# Patient Record
Sex: Female | Born: 1950
Health system: Southern US, Community
[De-identification: ages and names within clinical notes are randomized; demographics above are authoritative.]

---

## 1999-07-30 ENCOUNTER — Encounter: Payer: Self-pay | Admitting: *Deleted

## 1999-07-30 ENCOUNTER — Ambulatory Visit (HOSPITAL_COMMUNITY): Admission: RE | Admit: 1999-07-30 | Discharge: 1999-07-30 | Payer: Self-pay | Admitting: *Deleted

## 2003-01-05 HISTORY — PX: BREAST CYST ASPIRATION: SHX578

## 2003-05-23 ENCOUNTER — Other Ambulatory Visit: Admission: RE | Admit: 2003-05-23 | Discharge: 2003-05-23 | Payer: Self-pay | Admitting: Family Medicine

## 2003-07-01 ENCOUNTER — Encounter: Admission: RE | Admit: 2003-07-01 | Discharge: 2003-07-01 | Payer: Self-pay | Admitting: Surgery

## 2004-11-25 ENCOUNTER — Other Ambulatory Visit: Admission: RE | Admit: 2004-11-25 | Discharge: 2004-11-25 | Payer: Self-pay | Admitting: Obstetrics and Gynecology

## 2006-03-18 ENCOUNTER — Encounter: Admission: RE | Admit: 2006-03-18 | Discharge: 2006-03-18 | Payer: Self-pay | Admitting: Obstetrics and Gynecology

## 2006-03-24 ENCOUNTER — Encounter: Admission: RE | Admit: 2006-03-24 | Discharge: 2006-03-24 | Payer: Self-pay | Admitting: Obstetrics and Gynecology

## 2007-11-29 ENCOUNTER — Ambulatory Visit (HOSPITAL_COMMUNITY): Admission: RE | Admit: 2007-11-29 | Discharge: 2007-11-29 | Payer: Self-pay | Admitting: Obstetrics and Gynecology

## 2009-06-18 ENCOUNTER — Ambulatory Visit (HOSPITAL_COMMUNITY): Admission: RE | Admit: 2009-06-18 | Discharge: 2009-06-18 | Payer: Self-pay | Admitting: Obstetrics and Gynecology

## 2009-08-20 ENCOUNTER — Encounter: Admission: RE | Admit: 2009-08-20 | Discharge: 2009-10-02 | Payer: Self-pay | Admitting: Endocrinology

## 2010-08-20 ENCOUNTER — Ambulatory Visit (HOSPITAL_COMMUNITY)
Admission: RE | Admit: 2010-08-20 | Discharge: 2010-08-20 | Disposition: A | Discharge status: Home / Self Care | Payer: 59 | Source: Ambulatory Visit | Attending: Obstetrics and Gynecology | Admitting: Obstetrics and Gynecology

## 2010-08-20 ENCOUNTER — Other Ambulatory Visit (HOSPITAL_COMMUNITY): Payer: Self-pay | Admitting: Obstetrics and Gynecology

## 2010-08-20 DIAGNOSIS — Z1231 Encounter for screening mammogram for malignant neoplasm of breast: Secondary | ICD-10-CM | POA: Insufficient documentation

## 2012-08-23 ENCOUNTER — Other Ambulatory Visit (HOSPITAL_COMMUNITY): Payer: Self-pay | Admitting: Obstetrics and Gynecology

## 2012-08-23 DIAGNOSIS — Z1231 Encounter for screening mammogram for malignant neoplasm of breast: Secondary | ICD-10-CM

## 2012-08-25 ENCOUNTER — Ambulatory Visit (HOSPITAL_COMMUNITY)
Admission: RE | Admit: 2012-08-25 | Discharge: 2012-08-25 | Disposition: A | Discharge status: Home / Self Care | Payer: 59 | Source: Ambulatory Visit | Attending: Obstetrics and Gynecology | Admitting: Obstetrics and Gynecology

## 2012-08-25 DIAGNOSIS — Z1231 Encounter for screening mammogram for malignant neoplasm of breast: Secondary | ICD-10-CM | POA: Insufficient documentation

## 2012-08-28 ENCOUNTER — Ambulatory Visit (HOSPITAL_COMMUNITY)
Admission: RE | Admit: 2012-08-28 | Discharge: 2012-08-28 | Disposition: A | Discharge status: Home / Self Care | Payer: 59 | Source: Ambulatory Visit | Attending: Obstetrics and Gynecology | Admitting: Obstetrics and Gynecology

## 2013-06-20 ENCOUNTER — Ambulatory Visit (INDEPENDENT_AMBULATORY_CARE_PROVIDER_SITE_OTHER): Payer: 59

## 2013-06-20 ENCOUNTER — Ambulatory Visit: Payer: 59

## 2013-06-20 VITALS — BP 147/87 | HR 84 | Resp 12

## 2013-06-20 DIAGNOSIS — M2010 Hallux valgus (acquired), unspecified foot: Secondary | ICD-10-CM

## 2013-06-20 DIAGNOSIS — R52 Pain, unspecified: Secondary | ICD-10-CM

## 2013-06-20 DIAGNOSIS — M1990 Unspecified osteoarthritis, unspecified site: Secondary | ICD-10-CM

## 2013-06-20 DIAGNOSIS — M2020 Hallux rigidus, unspecified foot: Secondary | ICD-10-CM

## 2013-06-20 DIAGNOSIS — M2040 Other hammer toe(s) (acquired), unspecified foot: Secondary | ICD-10-CM

## 2013-06-20 NOTE — Patient Instructions (Signed)
Hallux Rigidus Hallux rigidus is a condition involving pain and a loss of motion of the first (big) toe. The pain gets worse with lifting up (extension) of the toe. This is usually due to arthritic bony bumps (spurring) of the joint at the base of the big toe.  SYMPTOMS   Pain, with lifting up of the toe.  Tenderness over the joint where the big toe meets the foot.  Redness, swelling, and warmth over the top of the base of the big toe (sometimes).  Foot pain, stiffness, and limping. CAUSES  Halllux rigidus is caused by arthritis of the joint where the big toe meets the foot. The arthritis creates a bone spur that pinches the soft tissues, when the toe is extended. RISK INCREASES WITH:  Tight shoes, with a narrow toe box.  Family history of foot problems.  Gout and rheumatoid and psoriatic arthritis.  History of previous toe injury, including "turf toe."  Long first toe, flat feet, and other big toe bony bumps.  Arthritis of the big toe. PREVENTION   Wear wide toed shoes that fit well.  Tape the big toe, to reduce motion and to prevent pinching of the tissues between the bone.  Maintain physical fitness:  Foot and ankle flexibility.  Muscle strength and endurance. PROGNOSIS  This condition can usually be managed with proper treatment. However, surgery is typically required to prevent the problem from recurring.  RELATED COMPLICATIONS  Injury to other areas of the foot or ankle, caused by abnormal walking in an attempt to avoid the pain felt when walking normally. TREATMENT Treatment first involves stopping the activities that aggravate your symptoms. Ice and medicine can be used to reduce the pain and inflammation. Modifications to shoes may help reduce pain, including wearing stiff-soled shoes, shoes with a wide toe box, inserting a padded donut to relieve pressure on top of the joint, or wearing an arch support. Corticosteroid injections may be given to reduce inflammation.  If non-surgical treatment is unsuccessful, surgery may be needed. Surgical options include removing the arthritic bony spur, cutting a bone in the foot to change the arc of motion (allowing the toe to extend more), or fusion of the joint (eliminating all motion in the joint at the base of the big toe).  MEDICATION   If pain medicine is needed, nonsteroidal anti-inflammatory medicines (aspirin and ibuprofen), or other minor pain relievers (acetaminophen), are often advised.  Do not take pain medicine for 7 days before surgery.  Prescription pain relievers are usually prescribed only after surgery. Use only as directed and only as much as you need.  Ointments for arthritis, applied to the skin, may give some relief.  Injections of corticosteroids may be given to reduce inflammation. HEAT AND COLD  Cold treatment (icing) relieves pain and reduces inflammation. Cold treatment should be applied for 10 to 15 minutes every 2 to 3 hours, and immediately after activity that aggravates your symptoms. Use ice packs or an ice massage.  Heat treatment may be used before performing the stretching and strengthening activities prescribed by your caregiver, physical therapist, or athletic trainer. Use a heat pack or a warm water soak. SEEK MEDICAL CARE IF:   Symptoms get worse or do not improve in 2 weeks, despite treatment.  After surgery you develop fever, increasing pain, redness, swelling, drainage of fluids, bleeding, or increasing warmth.  New, unexplained symptoms develop. (Drugs used in treatment may produce side effects.) Document Released: 12/21/2004 Document Revised: 03/15/2011 Document Reviewed: 04/04/2008 Exit[employer] Patient   Information 2015 [employer]. This information is not intended to replace advice given to you by your health care Yeray Tomas. Make sure you discuss any questions you have with your health care Acxel Dingee.  

## 2013-06-20 NOTE — Progress Notes (Signed)
   Subjective:    Patient ID: Jasmine Chambers, female    DOB: 01/19/1950, 63 y.o.   MRN: [id number]  HPI PT STATED RT FOOT 1ST AND 3RD TOE IS BEEN HURTING FOR 1-2 YEARS. THE TOES IS GETTING WORSE. THE TOES GET AGGRAVATED BY PRESSURE AND TRIED TO WEAR TOE SEPARATOR BUT IT HELPS SOME.    Review of Systems  All other systems reviewed and are negative.      Objective:   Physical Exam Neurovascular status is intact pedal pulses palpable DP and PT +2/4 capillary refill time 3 seconds all digits neurologically skin color pigment normal hair growth absent nails, criptotic orthopedic biomechanical exam feels rectus foot type with slight HAV deformity bilateral there is adductovarus rotation and rigid contracture lesser digits 2345 right more so than left fourth right actually she called the third is at the fourth toe right is the most symptomatic she is walking on the tip of the toe causing a slight distal clavus. Patient has limited range of motion of the first MTP area bilateral it with 45 on the left only about 5-10 on the right x-rays demonstrate asymmetric joint space during the right first MP. It were spurring and dorsal spurring and osteophyte of dorsal first metatarsal phalangeal joint mild last opinion is also identified. Clinically patient has limited range of motion right hallux more so than left has and extensors deformity of the IP joints of both hallux right more so than left now asymptomatic however with certain shoes activities becomes aggravated also has difficulty with certain shoes with the third or correction fourth toe right foot due to semirigid digital contractures.     Assessment & Plan:  Assessment this time is capsulitis and hallux rigidus with posture arthropathy first MTP joint right mild HAV deformity and hammertoe deformities with rigid digital contractures of the fourth right being noted left foot is not as severe both from hallux rigidus and hammertoe deformities. Patient  given literature about osteoarthritis and hallux rigidus for her review of is a strong candidate some point in the future for possible arthrotomy with possible implant arthroplasty consider a year silastic or a two-piece or one-piece hemi-implant to will discuss more details when she is ready to consider surgery currently not having pain at this time elects to follow a conservative care currently wearing a [employer] thick soled shoes advised to maintain thick soled shoes no barefoot no flimsy shoes or flip-flops also recommended Advil or ibuprofen or Tylenol as needed for pain in the interim which are dispensed for her review and will followup when she is ready to consider surgical intervention. Offer the possibly of a steroid injection for temporary relief if needed  Alvan Dameichard Sikora DPM

## 2014-06-18 ENCOUNTER — Other Ambulatory Visit (HOSPITAL_COMMUNITY): Payer: Self-pay | Admitting: Obstetrics and Gynecology

## 2014-06-18 DIAGNOSIS — Z1231 Encounter for screening mammogram for malignant neoplasm of breast: Secondary | ICD-10-CM

## 2014-06-21 ENCOUNTER — Ambulatory Visit (HOSPITAL_COMMUNITY)
Admission: RE | Admit: 2014-06-21 | Discharge: 2014-06-21 | Disposition: A | Discharge status: Home / Self Care | Payer: 59 | Source: Ambulatory Visit | Attending: Obstetrics and Gynecology | Admitting: Obstetrics and Gynecology

## 2014-06-21 DIAGNOSIS — Z1231 Encounter for screening mammogram for malignant neoplasm of breast: Secondary | ICD-10-CM | POA: Diagnosis present

## 2015-07-21 DIAGNOSIS — E785 Hyperlipidemia, unspecified: Secondary | ICD-10-CM | POA: Diagnosis not present

## 2015-07-21 DIAGNOSIS — Z23 Encounter for immunization: Secondary | ICD-10-CM | POA: Diagnosis not present

## 2015-07-21 DIAGNOSIS — Z0000 Encounter for general adult medical examination without abnormal findings: Secondary | ICD-10-CM | POA: Diagnosis not present

## 2015-07-21 DIAGNOSIS — Z131 Encounter for screening for diabetes mellitus: Secondary | ICD-10-CM | POA: Diagnosis not present

## 2015-07-21 DIAGNOSIS — I10 Essential (primary) hypertension: Secondary | ICD-10-CM | POA: Diagnosis not present

## 2015-07-21 DIAGNOSIS — R7309 Other abnormal glucose: Secondary | ICD-10-CM | POA: Diagnosis not present

## 2015-10-21 ENCOUNTER — Encounter: Payer: Self-pay | Admitting: Sports Medicine

## 2015-10-21 ENCOUNTER — Ambulatory Visit (INDEPENDENT_AMBULATORY_CARE_PROVIDER_SITE_OTHER): Payer: Medicare Other | Admitting: Sports Medicine

## 2015-10-21 ENCOUNTER — Ambulatory Visit (INDEPENDENT_AMBULATORY_CARE_PROVIDER_SITE_OTHER): Payer: Medicare Other

## 2015-10-21 DIAGNOSIS — M79673 Pain in unspecified foot: Secondary | ICD-10-CM | POA: Diagnosis not present

## 2015-10-21 DIAGNOSIS — M2021 Hallux rigidus, right foot: Secondary | ICD-10-CM

## 2015-10-21 DIAGNOSIS — M8580 Other specified disorders of bone density and structure, unspecified site: Secondary | ICD-10-CM | POA: Diagnosis not present

## 2015-10-21 DIAGNOSIS — M2022 Hallux rigidus, left foot: Secondary | ICD-10-CM

## 2015-10-21 LAB — CALCIUM: CALCIUM: 9.4 mg/dL (ref 8.6–10.4)

## 2015-10-21 NOTE — Progress Notes (Signed)
Subjective: Jasmine Chambers is a 65 y.o. female patient who presents to office for evaluation of Right> Left bunion pain. Patient complains of progressive pain especially over the last year in the Right>Left foot that starts as pain over the bump with direct pressure and range of motion and is now interferring with daily activities.  Patient has also tried change in shoes. Worse with high heels. Patient denies any other pedal complaints.   There are no active problems to display for this patient.   Current Outpatient Prescriptions on File Prior to Visit  Medication Sig Dispense Refill  . ACCU-CHEK AVIVA PLUS test strip     . ACCU-CHEK SOFTCLIX LANCETS lancets     . ARMOUR THYROID 60 MG tablet     . liothyronine (CYTOMEL) 25 MCG tablet     . Progesterone Micronized (PROGESTERONE, BULK,) POWD     . Testosterone Propionate POWD      No current facility-administered medications on file prior to visit.     No Known Allergies  Objective:  General: Alert and oriented x3 in no acute distress  Dermatology: No open lesions bilateral lower extremities, no webspace macerations, no ecchymosis bilateral, all nails x 10 are well manicured.  Vascular: Dorsalis Pedis and Posterior Tibial pedal pulses 2/4, Capillary Fill Time 3 seconds, (+) pedal hair growth bilateral, no edema bilateral lower extremities, Temperature gradient within normal limits.  Neurology: Michaell CowingGross sensation intact via light touch bilateral, Protective sensation intact with Phoebe PerchSemmes Weinstein Monofilament to all pedal sites, Position sense intact, vibratory intact bilateral, Deep tendon reflexes within normal limits bilateral, No babinski sign present bilateral.   Musculoskeletal: Mild tenderness with palpation right>left dorsal bunion deformity with limitation or no crepitus with range of motion. Midtarsal, Subtalar joint, and ankle joint range of motion is within normal limits. On weightbearing exam, there is decreased 1st MTPJ rom  Right>Left with functional limitus noted, there is medial arch collapse Right> Left on weightbearing, rearfoot slight valgus, forefoot slight abduction with rigidus deformity supported on ground with no second toe crossover deformity noted.   Xrays  Right and Left Foot    Impression: Mild decreased mineralization. 1st MTPJ joint narrowing with dorsal spur, mild elevatus, calcaneal spur and midtarsal breech. No other acute findings.       Assessment and Plan: Problem List Items Addressed This Visit    None    Visit Diagnoses    Pain of foot, unspecified laterality    -  Primary   Relevant Orders   DG Foot 2 Views Left   DG Foot 2 Views Right   Calcium   Hallux rigidus of both feet       Relevant Orders   Calcium   Osteopenia, unspecified location       Relevant Orders   Calcium       -Complete examination performed -Xrays reviewed -Discussed treatement options; discussed Rigidus deformity;conservative and  Surgical management; risks, benefits, alternatives discussed. All patient's questions answered. -Rx bloodwork to check calcium to determine if supplication is needed in prep for surgery; Youngswick in right  -Recommend continue with good supportive shoes and inserts.  -Patient to return to office as needed or sooner if condition worsens. Will call patient with lab results to determine surgery consult timeframe based on results  Asencion Islamitorya Gorge Almanza, DPM

## 2015-10-21 NOTE — Patient Instructions (Signed)
Hallux Rigidus Hallux rigidus is a condition involving pain and a loss of motion of the first (big) toe. The pain gets worse with lifting up (extension) of the toe. This is usually due to arthritic bony bumps (spurring) of the joint at the base of the big toe.  SYMPTOMS   Pain, with lifting up of the toe.  Tenderness over the joint where the big toe meets the foot.  Redness, swelling, and warmth over the top of the base of the big toe (sometimes).  Foot pain, stiffness, and limping. CAUSES  Hallux rigidus is caused by arthritis of the joint where the big toe meets the foot. The arthritis creates a bone spur that pinches the soft tissues when the toe is extended. RISK INCREASES WITH:  Tight shoes with a narrow toe box.  Family history of foot problems.  Gout and rheumatoid and psoriatic arthritis.  History of previous toe injury, including "turf toe."  Long first toe, flat feet, and other big toe bony bumps.  Arthritis of the big toe. PREVENTION   Wear wide-toed shoes that fit well.  Tape the big toe to reduce motion and to prevent pinching of the tissues between the bone.  Maintain physical fitness:  Foot and ankle flexibility.  Muscle strength and endurance. PROGNOSIS  This condition can usually be managed with proper treatment. However, surgery is typically required to prevent the problem from recurring.  RELATED COMPLICATIONS  Injury to other areas of the foot or ankle, caused by abnormal walking in an attempt to avoid the pain felt when walking normally. TREATMENT Treatment first involves stopping the activities that aggravate your symptoms. Ice and medicine can be used to reduce the pain and inflammation. Modifications to shoes may help reduce pain, including wearing stiff-soled shoes, shoes with a wide toe box, inserting a padded donut to relieve pressure on top of the joint, or wearing an arch support. Corticosteroid injections may be given to reduce inflammation. If  nonsurgical treatment is unsuccessful, surgery may be needed. Surgical options include removing the arthritic bony spur, cutting a bone in the foot to change the arc of motion (allowing the toe to extend more), or fusion of the joint (eliminating all motion in the joint at the base of the big toe).  MEDICATION   If pain medicine is needed, nonsteroidal anti-inflammatory medicines (aspirin and ibuprofen), or other minor pain relievers (acetaminophen), are often advised.  Do not take pain medicine for 7 days before surgery.  Prescription pain relievers are usually prescribed only after surgery. Use only as directed and only as much as you need.  Ointments for arthritis, applied to the skin, may give some relief.  Injections of corticosteroids may be given to reduce inflammation. HEAT AND COLD  Cold treatment (icing) relieves pain and reduces inflammation. Cold treatment should be applied for 10 to 15 minutes every 2 to 3 hours, and immediately after activity that aggravates your symptoms. Use ice packs or an ice massage.  Heat treatment may be used before performing the stretching and strengthening activities prescribed by your caregiver, physical therapist, or athletic trainer. Use a heat pack or a warm water soak. SEEK MEDICAL CARE IF:   Symptoms get worse or do not improve in 2 weeks, despite treatment.  After surgery you develop fever, increasing pain, redness, swelling, drainage of fluids, bleeding, or increasing warmth.  New, unexplained symptoms develop. (Drugs used in treatment may produce side effects.)   This information is not intended to replace advice given to   you by your health care provider. Make sure you discuss any questions you have with your health care provider.   Document Released: 12/21/2004 Document Revised: 01/11/2014 Document Reviewed: 04/04/2008 Elsevier Interactive Patient Education 2016 Else[employer].  

## 2015-10-24 ENCOUNTER — Telehealth: Payer: Self-pay | Admitting: *Deleted

## 2015-10-24 NOTE — Telephone Encounter (Addendum)
-----   Message from Asencion Islamitorya Stover, [place] sent at 10/22/2015  7:25 AM EDT ----- Naaman PlummerHi Valery, Can you let patient know that her Calcium levels are normal and that I can see her in 2 weeks (10/31) for surgery consult if she desires to proceed with a more in depth surgical discussion and to sign consent Thanks Dr. Marylene LandStover. 10/24/2015-left message with Dr. Wynema BirchStover's review of labs and recommendation.

## 2015-11-04 ENCOUNTER — Ambulatory Visit (INDEPENDENT_AMBULATORY_CARE_PROVIDER_SITE_OTHER): Payer: Medicare Other | Admitting: Sports Medicine

## 2015-11-04 ENCOUNTER — Encounter: Payer: Self-pay | Admitting: Sports Medicine

## 2015-11-04 DIAGNOSIS — M79673 Pain in unspecified foot: Secondary | ICD-10-CM

## 2015-11-04 DIAGNOSIS — M2022 Hallux rigidus, left foot: Secondary | ICD-10-CM

## 2015-11-04 DIAGNOSIS — M2021 Hallux rigidus, right foot: Secondary | ICD-10-CM

## 2015-11-04 NOTE — Progress Notes (Signed)
Subjective: Felecia ShellingMinnie P Hartis is a 65 y.o. female patient who returns to office for evaluation of Right> Left bunion pain and for surgery consultation after completing blood work. Patient denies any other pedal complaints.   There are no active problems to display for this patient.   Current Outpatient Prescriptions on File Prior to Visit  Medication Sig Dispense Refill  . ACCU-CHEK AVIVA PLUS test strip     . ACCU-CHEK SOFTCLIX LANCETS lancets     . ARMOUR THYROID 60 MG tablet     . hydrochlorothiazide (HYDRODIURIL) 25 MG tablet Take 25 mg by mouth daily.  1  . liothyronine (CYTOMEL) 25 MCG tablet     . Progesterone Micronized (PROGESTERONE, BULK,) POWD     . Testosterone Propionate POWD      No current facility-administered medications on file prior to visit.     No Known Allergies   No family history on file.   No past surgical history on file.   Social History   Social History  . Marital status: Married    Spouse name: N/A  . Number of children: N/A  . Years of education: N/A   Social History Main Topics  . Smoking status: Never Smoker  . Smokeless tobacco: None  . Alcohol use No  . Drug use: No  . Sexual activity: Not Asked   Other Topics Concern  . None   Social History Narrative  . None   Objective:  General: Alert and oriented x3 in no acute distress  Dermatology: No open lesions bilateral lower extremities, no webspace macerations, no ecchymosis bilateral, all nails x 10 are well manicured.  Vascular: Dorsalis Pedis and Posterior Tibial pedal pulses 2/4, Capillary Fill Time 3 seconds, (+) pedal hair growth bilateral, no edema bilateral lower extremities, Temperature gradient within normal limits.  Neurology: Michaell CowingGross sensation intact via light touch bilateral, Protective sensation intact with Phoebe PerchSemmes Weinstein Monofilament to all pedal sites, Position sense intact, vibratory intact bilateral, Deep tendon reflexes within normal limits bilateral, No babinski sign  present bilateral.   Musculoskeletal: Mild tenderness with palpation right>left dorsal bunion deformity with limitation or no crepitus with range of motion. Midtarsal, Subtalar joint, and ankle joint range of motion is within normal limits. On weightbearing exam, there is decreased 1st MTPJ rom Right>Left with functional limitus noted, there is medial arch collapse Right> Left on weightbearing, rearfoot slight valgus, forefoot slight abduction with rigidus deformity supported on ground with no second toe crossover deformity noted.       Assessment and Plan: Problem List Items Addressed This Visit    None    Visit Diagnoses    Hallux rigidus of both feet    -  Primary   R>L   Pain of foot, unspecified laterality          -Complete examination performed -Vit D/Calcuim labs normal -Previous Xrays reviewed  -Discussed treatement options; discussed Rigidus deformity;conservative and  Surgical management; risks, benefits, alternatives discussed. All patient's questions answered. -Patient opt for surgical management. Consent obtained for right youngswick with screw and stravix allograft. Pre and Post op course explained. Risks, benefits, alternatives explained. No guarantees given or implied. Surgical booking slip submitted and provided patient with Surgical packet and info for [facility]. -Dispensed CAM Walker to use post op. To dispense crutches at surgery center -Recommend continue with good supportive shoes and inserts meanwhile.  -Patient to return to office after surgery or sooner if condition worsens.   Asencion Islamitorya Peaches Vanoverbeke, DPM

## 2015-11-04 NOTE — Patient Instructions (Signed)
Pre-Operative Instructions  Congratulations, you have decided to take an important step to improving your quality of life.  You can be assured that the doctors of [facility] will be with you every step of the way.  1. Plan to be at the surgery center/hospital at least 1 (one) hour prior to your scheduled time unless otherwise directed by the surgical center/hospital staff.  You must have a responsible adult accompany you, remain during the surgery and drive you home.  Make sure you have directions to the surgical center/hospital and know how to get there on time. 2. For hospital based surgery you will need to obtain a history and physical form from your family physician within 1 month prior to the date of surgery- we will give you a form for you primary physician.  3. We make every effort to accommodate the date you request for surgery.  There are however, times where surgery dates or times have to be moved.  We will contact you as soon as possible if a change in schedule is required.   4. No Aspirin/Ibuprofen for one week before surgery.  If you are on aspirin, any non-steroidal anti-inflammatory medications (Mobic, Aleve, Ibuprofen) you should stop taking it 7 days prior to your surgery.  You make take Tylenol  For pain prior to surgery.  5. Medications- If you are taking daily heart and blood pressure medications, seizure, reflux, allergy, asthma, anxiety, pain or diabetes medications, make sure the surgery center/hospital is aware before the day of surgery so they may notify you which medications to take or avoid the day of surgery. 6. No food or drink after midnight the night before surgery unless directed otherwise by surgical center/hospital staff. 7. No alcoholic beverages 24 hours prior to surgery.  No smoking 24 hours prior to or 24 hours after surgery. 8. Wear loose pants or shorts- loose enough to fit over bandages, boots, and casts. 9. No slip on shoes, sneakers are best. 10. Bring  your boot with you to the surgery center/hospital.  Also bring crutches or a walker if your physician has prescribed it for you.  If you do not have this equipment, it will be provided for you after surgery. 11. If you have not been contracted by the surgery center/hospital by the day before your surgery, call to confirm the date and time of your surgery. 12. Leave-time from work may vary depending on the type of surgery you have.  Appropriate arrangements should be made prior to surgery with your employer. 13. Prescriptions will be provided immediately following surgery by your doctor.  Have these filled as soon as possible after surgery and take the medication as directed. 14. Remove nail polish on the operative foot. 15. Wash the night before surgery.  The night before surgery wash the foot and leg well with the antibacterial soap provided and water paying special attention to beneath the toenails and in between the toes.  Rinse thoroughly with water and dry well with a towel.  Perform this wash unless told not to do so by your physician.  Enclosed: 1 Ice pack (please put in freezer the night before surgery)   1 Hibiclens skin cleaner   Pre-op Instructions  If you have any questions regarding the instructions, do not hesitate to call our office.  [place]: [address] [place], [place] [address] [phone]  [place]: [address] , [place] [address] [phone]  [place]: [address]  [place], [place] [address] [phone]   Dr.   Norman Regal DPM, Dr. Matthew Wagoner DPM, Dr. M. Todd Hyatt DPM, Dr. Rodney Wigger DPM 

## 2015-11-06 ENCOUNTER — Telehealth: Payer: Self-pay | Admitting: *Deleted

## 2015-11-06 NOTE — Telephone Encounter (Signed)
[employer] has a graft I can use. Matt sides the rep is aware and said he will talk with Arlys JohnBrian. -Dr. Marylene LandStover

## 2015-11-06 NOTE — Telephone Encounter (Signed)
"  The Jasmine Chambers is too expensive.  Is there something else she can use for this patient?  Medicare is her primary insurance.  She is scheduled for November 13."  I will send Dr. Marylene LandStover a message and call you back.

## 2015-11-07 DIAGNOSIS — R05 Cough: Secondary | ICD-10-CM | POA: Diagnosis not present

## 2015-11-17 ENCOUNTER — Encounter: Payer: Self-pay | Admitting: Sports Medicine

## 2015-11-17 DIAGNOSIS — M2021 Hallux rigidus, right foot: Secondary | ICD-10-CM | POA: Diagnosis not present

## 2015-11-17 DIAGNOSIS — M2011 Hallux valgus (acquired), right foot: Secondary | ICD-10-CM | POA: Diagnosis not present

## 2015-11-17 DIAGNOSIS — E7800 Pure hypercholesterolemia, unspecified: Secondary | ICD-10-CM | POA: Diagnosis not present

## 2015-11-17 DIAGNOSIS — M25571 Pain in right ankle and joints of right foot: Secondary | ICD-10-CM | POA: Diagnosis not present

## 2015-11-18 ENCOUNTER — Telehealth: Payer: Self-pay | Admitting: Sports Medicine

## 2015-11-18 NOTE — Telephone Encounter (Signed)
Post op check phone call made to patient. Husband answered stating that wife is doing good. Sensation is starting to slowly come back in her surgical foot. I advised to continue with taking pain medication of Norco as Rx, elevating and icing. Husband states that they have not iced yet I encouraged icing 20 mins of each hour behind knee while in boot or with boot off directly to top of foot. Husband expressed understanding. Patient to follow up next week in office for continued post-op care or sooner if problems arise. -Dr. Marylene LandStover

## 2015-11-19 ENCOUNTER — Telehealth: Payer: Self-pay | Admitting: *Deleted

## 2015-11-19 NOTE — Telephone Encounter (Signed)
Pt states she is having pain and wanted to know what to do? I asked pt to describe the pain and she said it was kind of an aching.  I told pt if she was able to tolerate Motrin OTC she could take 2 tablets 3-4 times in between the dosing of the pain medication. I told pt I could instruct her in a comfort measure to help alleviate some of the throbbing. I instructed pt to remove the surgical boot, remove the open ended sock, remove the ace wrap and elevate the foot for 15 minutes, if pain worsens elevated then dangle for 15 minutes and this would be the only time dangling was recommended, after the 15 minutes place foot level with hip and rewrap the ace wrap looser beginning with the toes going up the leg, replace sock and surgical boot. Pt asked that I explain to her husband Calvin.pt states she is consitpated. I told pt to purchase OTC Colace and take as directed for constipation.  I called back on Calvin's phone number and explained the comfort measure technique I had explained to pt. Calvin states understanding.

## 2015-11-25 ENCOUNTER — Ambulatory Visit (INDEPENDENT_AMBULATORY_CARE_PROVIDER_SITE_OTHER): Payer: Medicare Other | Admitting: Sports Medicine

## 2015-11-25 ENCOUNTER — Ambulatory Visit (INDEPENDENT_AMBULATORY_CARE_PROVIDER_SITE_OTHER): Payer: Medicare Other

## 2015-11-25 ENCOUNTER — Encounter: Payer: Self-pay | Admitting: Sports Medicine

## 2015-11-25 DIAGNOSIS — M79673 Pain in unspecified foot: Secondary | ICD-10-CM

## 2015-11-25 DIAGNOSIS — M2021 Hallux rigidus, right foot: Secondary | ICD-10-CM | POA: Diagnosis not present

## 2015-11-25 DIAGNOSIS — Z9889 Other specified postprocedural states: Secondary | ICD-10-CM

## 2015-11-25 DIAGNOSIS — M2022 Hallux rigidus, left foot: Secondary | ICD-10-CM

## 2015-11-25 DIAGNOSIS — M8580 Other specified disorders of bone density and structure, unspecified site: Secondary | ICD-10-CM

## 2015-11-25 MED ORDER — HYDROCODONE-ACETAMINOPHEN 10-325 MG PO TABS: 1.0000 | ORAL_TABLET | Freq: Four times a day (QID) | ORAL | 0 refills | Status: DC | PRN

## 2015-11-25 NOTE — Progress Notes (Signed)
  Subjective: Jasmine Chambers is a 65 y.o. female patient seen today in office for POV #1 (DOS 11-17-15), S/P Right youngswick bunionectomy with screw fixation. Patient denies pain at surgical site, denies calf pain, denies headache, chest pain, shortness of breath, nausea, vomiting, fever, or chills. Patient states that she is doing well and is only taking Ibuprofen and Norco but less than before. No other issues noted.   There are no active problems to display for this patient.   Current Outpatient Prescriptions on File Prior to Visit  Medication Sig Dispense Refill  . ACCU-CHEK AVIVA PLUS test strip     . ACCU-CHEK SOFTCLIX LANCETS lancets     . ARMOUR THYROID 60 MG tablet     . hydrochlorothiazide (HYDRODIURIL) 25 MG tablet Take 25 mg by mouth daily.  1  . liothyronine (CYTOMEL) 25 MCG tablet     . Progesterone Micronized (PROGESTERONE, BULK,) POWD     . Testosterone Propionate POWD      No current facility-administered medications on file prior to visit.     No Known Allergies  Objective: There were no vitals filed for this visit.  General: No acute distress, AAOx3  Right foot: Sutures intact with no gapping or dehiscence at surgical site, mild swelling to right forefoot, no erythema, no warmth, no drainage, no signs of infection noted, Capillary fill time <3 seconds in all digits, gross sensation present via light touch to right foot. No pain or crepitation with range of motion right foot.  No pain with calf compression.   Post Op Xray, Right foot: 1st met in excellent alignment and position. Osteotomy site healing. Hardware intact. Soft tissue swelling within normal limits for post op status.   Assessment and Plan:  Problem List Items Addressed This Visit    None    Visit Diagnoses    S/P foot surgery, right    -  Primary   Relevant Medications   HYDROcodone-acetaminophen (NORCO) 10-325 MG tablet   Hallux rigidus of both feet       Relevant Orders   DG Foot Complete  Right   Pain of foot, unspecified laterality       Relevant Orders   DG Foot Complete Right   Osteopenia, unspecified location       Relevant Orders   DG Foot Complete Right       -Patient seen and evaluated -Xrays reviewed  -Applied dry sterile dressing to surgical site right foot secured with ACE wrap and stockinet  -Advised patient to make sure to keep dressings clean, dry, and intact to right surgical site, removing the ACE as needed  -Advised patient to continue with CAM boot to right foot   -Advised patient to limit activity to necessity  -Advised patient to ice and elevate as necessary -Refilled Norco   -Will plan for suture removal at next office visit and will encourage range of motion. In the meantime, patient to call office if any issues or problems arise.   Landis Martins, DPM

## 2015-12-02 ENCOUNTER — Ambulatory Visit (INDEPENDENT_AMBULATORY_CARE_PROVIDER_SITE_OTHER): Payer: Medicare Other | Admitting: Sports Medicine

## 2015-12-02 ENCOUNTER — Encounter: Payer: Self-pay | Admitting: Sports Medicine

## 2015-12-02 DIAGNOSIS — M79673 Pain in unspecified foot: Secondary | ICD-10-CM

## 2015-12-02 DIAGNOSIS — M2021 Hallux rigidus, right foot: Secondary | ICD-10-CM

## 2015-12-02 DIAGNOSIS — M2022 Hallux rigidus, left foot: Secondary | ICD-10-CM

## 2015-12-02 DIAGNOSIS — M8580 Other specified disorders of bone density and structure, unspecified site: Secondary | ICD-10-CM

## 2015-12-02 DIAGNOSIS — Z9889 Other specified postprocedural states: Secondary | ICD-10-CM

## 2015-12-02 NOTE — Progress Notes (Signed)
  Subjective: Jasmine Chambers is a 65 y.o. female patient seen today in office for POV #2 (DOS 11-17-15), S/P Right youngswick bunionectomy with screw fixation. Patient denies pain at surgical site, denies calf pain, denies headache, chest pain, shortness of breath, nausea, vomiting, fever, or chills. Patient states that she is doing well and is only taking Ibuprofen and Norco but less than before, slowly weaning. No other issues noted.   There are no active problems to display for this patient.   Current Outpatient Prescriptions on File Prior to Visit  Medication Sig Dispense Refill  . ACCU-CHEK AVIVA PLUS test strip     . ACCU-CHEK SOFTCLIX LANCETS lancets     . ARMOUR THYROID 60 MG tablet     . docusate sodium (COLACE) 100 MG capsule TAKE ONE CAPSULE EVERY 6 HOURS AS NEEDED FOR CONSTIPATION  0  . hydrochlorothiazide (HYDRODIURIL) 25 MG tablet Take 25 mg by mouth daily.  1  . HYDROcodone-acetaminophen (NORCO) 10-325 MG tablet Take 1 tablet by mouth every 6 (six) hours as needed. for pain 30 tablet 0  . HYDROcodone-homatropine (HYCODAN) 5-1.5 MG/5ML syrup TAKE 5 ML AT BEDTIME AS NEEDED FOR COUGH  0  . liothyronine (CYTOMEL) 25 MCG tablet     . Progesterone Micronized (PROGESTERONE, BULK,) POWD     . promethazine (PHENERGAN) 25 MG tablet TAKE 1 TABLET EVERY 8 HOURS AS NEEDED FOR NAUSEA  0  . Testosterone Propionate POWD      No current facility-administered medications on file prior to visit.     No Known Allergies  Objective: There were no vitals filed for this visit.  General: No acute distress, AAOx3  Right foot: Sutures intact with no gapping or dehiscence at surgical site, mild swelling to right forefoot, no erythema, no warmth, no drainage, no signs of infection noted, Capillary fill time <3 seconds in all digits, gross sensation present via light touch to right foot. Mild pain without crepitation with range of motion right foot.  No pain with calf compression.    Assessment and  Plan:  Problem List Items Addressed This Visit    None    Visit Diagnoses    S/P foot surgery, right    -  Primary   Hallux rigidus of both feet       Pain of foot, unspecified laterality       Osteopenia, unspecified location           -Patient seen and evaluated -Sutures removed and steristips applied -Patient may shower and replace ACE wrap daily  -Advised patient to continue with CAM boot to right foot   -Advised patient to limit activity to necessity  -Encouraged range of motion exercises of 1st MTPJ as instructed  -Advised patient to ice and elevate as necessary -Continue with PRN pain medications  -Will plan for xrays, transition out of boot to post op shoe, and compression anklet at next office visit and will continue to encourage range of motion exercises. In the meantime, patient to call office if any issues or problems arise.   Jasmine Chambers, DPM

## 2015-12-12 ENCOUNTER — Encounter: Payer: Self-pay | Admitting: Sports Medicine

## 2015-12-16 ENCOUNTER — Ambulatory Visit (INDEPENDENT_AMBULATORY_CARE_PROVIDER_SITE_OTHER): Payer: Medicare Other

## 2015-12-16 ENCOUNTER — Ambulatory Visit (INDEPENDENT_AMBULATORY_CARE_PROVIDER_SITE_OTHER): Payer: Medicare Other | Admitting: Sports Medicine

## 2015-12-16 DIAGNOSIS — M2022 Hallux rigidus, left foot: Principal | ICD-10-CM

## 2015-12-16 DIAGNOSIS — M2021 Hallux rigidus, right foot: Secondary | ICD-10-CM | POA: Diagnosis not present

## 2015-12-16 DIAGNOSIS — M79673 Pain in unspecified foot: Secondary | ICD-10-CM

## 2015-12-16 DIAGNOSIS — Z9889 Other specified postprocedural states: Secondary | ICD-10-CM

## 2015-12-16 NOTE — Progress Notes (Signed)
  Subjective: Jasmine Chambers is a 65 y.o. female patient seen today in office for POV #3 (DOS 11-17-15), S/P Right youngswick bunionectomy with screw fixation. Patient denies pain at surgical site, states that she gets some numbness and tingling, denies calf pain, denies headache, chest pain, shortness of breath, nausea, vomiting, fever, or chills. Patient states that she is doing well and is only taking Ibuprofen. No other issues noted.   There are no active problems to display for this patient.   Current Outpatient Prescriptions on File Prior to Visit  Medication Sig Dispense Refill  . ACCU-CHEK AVIVA PLUS test strip     . ACCU-CHEK SOFTCLIX LANCETS lancets     . ARMOUR THYROID 60 MG tablet     . docusate sodium (COLACE) 100 MG capsule TAKE ONE CAPSULE EVERY 6 HOURS AS NEEDED FOR CONSTIPATION  0  . hydrochlorothiazide (HYDRODIURIL) 25 MG tablet Take 25 mg by mouth daily.  1  . HYDROcodone-acetaminophen (NORCO) 10-325 MG tablet Take 1 tablet by mouth every 6 (six) hours as needed. for pain 30 tablet 0  . HYDROcodone-homatropine (HYCODAN) 5-1.5 MG/5ML syrup TAKE 5 ML AT BEDTIME AS NEEDED FOR COUGH  0  . liothyronine (CYTOMEL) 25 MCG tablet     . Progesterone Micronized (PROGESTERONE, BULK,) POWD     . promethazine (PHENERGAN) 25 MG tablet TAKE 1 TABLET EVERY 8 HOURS AS NEEDED FOR NAUSEA  0  . Testosterone Propionate POWD      No current facility-administered medications on file prior to visit.     No Known Allergies  Objective: There were no vitals filed for this visit.  General: No acute distress, AAOx3  Right foot: Incision healed intact with no gapping or dehiscence at surgical site, mild swelling to right forefoot, no erythema, no warmth, no drainage, no signs of infection noted, Capillary fill time <3 seconds in all digits, gross sensation present via light touch to right foot. Mild pain without crepitation with range of motion right foot.  No pain with calf compression.    Xray  right foot- hardware intact with osteotomy healing well.   Assessment and Plan:  Problem List Items Addressed This Visit    None    Visit Diagnoses    Hallux rigidus of both feet    -  Primary   Relevant Orders   DG Foot 2 Views Right   S/P foot surgery, right       Pain of foot, unspecified laterality           -Patient seen and evaluated -xrays reviewed -Dispensed compression ankle to use during day to assist with edema control -Dispensed post op shoe for patient to use for 2 weeks before slowly starting to using normal shoe  -Advised patient to limit activity to tolerance -Encouraged range of motion exercises of 1st MTPJ as instructed; If minimal improvement will put patient in PT at next visit -Advised patient to ice and elevate as necessary -Continue with PRN pain medications  -Return in 4 weeks. In the meantime, patient to call office if any issues or problems arise.   Asencion Islamitorya Tishawna Larouche, DPM

## 2016-01-06 ENCOUNTER — Ambulatory Visit (INDEPENDENT_AMBULATORY_CARE_PROVIDER_SITE_OTHER): Payer: Medicare Other

## 2016-01-06 ENCOUNTER — Ambulatory Visit (INDEPENDENT_AMBULATORY_CARE_PROVIDER_SITE_OTHER): Payer: Self-pay | Admitting: Sports Medicine

## 2016-01-06 DIAGNOSIS — M2021 Hallux rigidus, right foot: Secondary | ICD-10-CM | POA: Diagnosis not present

## 2016-01-06 DIAGNOSIS — M2022 Hallux rigidus, left foot: Secondary | ICD-10-CM | POA: Diagnosis not present

## 2016-01-06 DIAGNOSIS — Z9889 Other specified postprocedural states: Secondary | ICD-10-CM | POA: Diagnosis not present

## 2016-01-06 NOTE — Progress Notes (Signed)
  Subjective: Jasmine Chambers is a 66 y.o. female patient seen today in office for POV #4 (DOS 11-17-15), S/P Right youngswick bunionectomy with screw fixation. Patient denies pain at surgical site, states that she is doing better in normal shoe x 1 week, denies calf pain, denies headache, chest pain, shortness of breath, nausea, vomiting, fever, or chills. Patient states that she is doing well, request handicap. No other issues noted.   There are no active problems to display for this patient.   Current Outpatient Prescriptions on File Prior to Visit  Medication Sig Dispense Refill  . ACCU-CHEK AVIVA PLUS test strip     . ACCU-CHEK SOFTCLIX LANCETS lancets     . ARMOUR THYROID 60 MG tablet     . docusate sodium (COLACE) 100 MG capsule TAKE ONE CAPSULE EVERY 6 HOURS AS NEEDED FOR CONSTIPATION  0  . hydrochlorothiazide (HYDRODIURIL) 25 MG tablet Take 25 mg by mouth daily.  1  . HYDROcodone-acetaminophen (NORCO) 10-325 MG tablet Take 1 tablet by mouth every 6 (six) hours as needed. for pain 30 tablet 0  . HYDROcodone-homatropine (HYCODAN) 5-1.5 MG/5ML syrup TAKE 5 ML AT BEDTIME AS NEEDED FOR COUGH  0  . liothyronine (CYTOMEL) 25 MCG tablet     . Progesterone Micronized (PROGESTERONE, BULK,) POWD     . promethazine (PHENERGAN) 25 MG tablet TAKE 1 TABLET EVERY 8 HOURS AS NEEDED FOR NAUSEA  0  . Testosterone Propionate POWD      No current facility-administered medications on file prior to visit.     No Known Allergies  Objective: There were no vitals filed for this visit.  General: No acute distress, AAOx3  Right foot: Incision well healed at surgical site, mild swelling to right forefoot, no erythema, no warmth, no drainage, no signs of infection noted, Capillary fill time <3 seconds in all digits, gross sensation present via light touch to right foot. Mild pain without crepitation with range of motion right foot.  No pain with calf compression.    Xray right foot- hardware intact with  osteotomy healing well.   Assessment and Plan:  Problem List Items Addressed This Visit    None    Visit Diagnoses    Hallux rigidus of both feet    -  Primary   Relevant Orders   DG Foot Complete Right   S/P foot surgery, right       Relevant Orders   DG Foot Complete Right       -Patient seen and evaluated -Xrays reviewed -Continue with compression ankle to use during day to assist with edema control for an additional 4-6 weeks -Continue with normal shoe  -Advised patient to limit activity to tolerance -Encouraged range of motion exercises of 1st MTPJ as instructed; If minimal improvement will put patient in PT at next visit if needed -Advised patient to ice and elevate as necessary -Continue with PRN pain medications  -Gave temp handicap x 6months -Return in 8 weeks for follow up eval. In the meantime, patient to call office if any issues or problems arise.   Asencion Islamitorya Leanza Shepperson, DPM

## 2016-01-13 ENCOUNTER — Ambulatory Visit: Payer: Medicare Other

## 2016-01-14 NOTE — Progress Notes (Signed)
DOS 11.13.2017 Right Youngswick with Screw and Allograft for Dorsal Bunion and to Help Prevent Scar Tissue and contracture as you heal

## 2016-03-09 ENCOUNTER — Ambulatory Visit (INDEPENDENT_AMBULATORY_CARE_PROVIDER_SITE_OTHER): Payer: Self-pay | Admitting: Sports Medicine

## 2016-03-09 DIAGNOSIS — Z9889 Other specified postprocedural states: Secondary | ICD-10-CM

## 2016-03-09 DIAGNOSIS — M79673 Pain in unspecified foot: Secondary | ICD-10-CM

## 2016-03-09 NOTE — Progress Notes (Signed)
  Subjective: Felecia ShellingMinnie P Hinderer is a 66 y.o. female patient seen today in office for POV #5 (DOS 11-17-15), S/P Right youngswick bunionectomy with screw fixation. Patient denies pain at surgical site, states that she is doing better in normal shoe and has also tried Sunday shoes, denies calf pain, denies headache, chest pain, shortness of breath, nausea, vomiting, fever, or chills. Patient states that she is doing well; No other issues noted.   There are no active problems to display for this patient.   Current Outpatient Prescriptions on File Prior to Visit  Medication Sig Dispense Refill  . ACCU-CHEK AVIVA PLUS test strip     . ACCU-CHEK SOFTCLIX LANCETS lancets     . ARMOUR THYROID 60 MG tablet     . docusate sodium (COLACE) 100 MG capsule TAKE ONE CAPSULE EVERY 6 HOURS AS NEEDED FOR CONSTIPATION  0  . docusate sodium (COLACE) 100 MG capsule Take 100 mg by mouth daily as needed for mild constipation.    . hydrochlorothiazide (HYDRODIURIL) 25 MG tablet Take 25 mg by mouth daily.  1  . HYDROcodone-acetaminophen (NORCO) 10-325 MG tablet Take 1 tablet by mouth every 6 (six) hours as needed. for pain 30 tablet 0  . HYDROcodone-acetaminophen (NORCO) 10-325 MG tablet Take 1 tablet by mouth every 6 (six) hours as needed.    Marland Kitchen. HYDROcodone-homatropine (HYCODAN) 5-1.5 MG/5ML syrup TAKE 5 ML AT BEDTIME AS NEEDED FOR COUGH  0  . liothyronine (CYTOMEL) 25 MCG tablet     . Progesterone Micronized (PROGESTERONE, BULK,) POWD     . promethazine (PHENERGAN) 25 MG tablet TAKE 1 TABLET EVERY 8 HOURS AS NEEDED FOR NAUSEA  0  . promethazine (PHENERGAN) 25 MG tablet Take 25 mg by mouth every 8 (eight) hours as needed for nausea or vomiting.    . Testosterone Propionate POWD      No current facility-administered medications on file prior to visit.     No Known Allergies  Objective: There were no vitals filed for this visit.  General: No acute distress, AAOx3  Right foot: Incision well healed at surgical site  with mild scar, mild swelling to right forefoot, no erythema, no warmth, no drainage, no signs of infection noted, Capillary fill time <3 seconds in all digits, gross sensation present via light touch to right foot. No pain or crepitation with range of motion right foot.  No pain with calf compression.    Assessment and Plan:  Problem List Items Addressed This Visit    None    Visit Diagnoses    S/P foot surgery, right    -  Primary   Pain of foot, unspecified laterality          -Patient seen and evaluated -Continue with normal shoes -Advised patient to limit activity to tolerance; may resume works outs -Encouraged range of motion exercises of 1st MTPJ as instructed previously  -Advised patient to ice and elevate as necessary -Continue with PRN pain medications  -Continue with temp handicap x 4months -Return in 12 weeks for final xray/follow up eval. In the meantime, patient to call office if any issues or problems arise.   Asencion Islamitorya Marquett Bertoli, DPM

## 2016-06-08 ENCOUNTER — Ambulatory Visit (INDEPENDENT_AMBULATORY_CARE_PROVIDER_SITE_OTHER): Payer: Medicare Other

## 2016-06-08 ENCOUNTER — Ambulatory Visit (INDEPENDENT_AMBULATORY_CARE_PROVIDER_SITE_OTHER): Payer: Medicare Other | Admitting: Sports Medicine

## 2016-06-08 ENCOUNTER — Encounter: Payer: Self-pay | Admitting: Sports Medicine

## 2016-06-08 DIAGNOSIS — M2021 Hallux rigidus, right foot: Secondary | ICD-10-CM

## 2016-06-08 DIAGNOSIS — Z9889 Other specified postprocedural states: Secondary | ICD-10-CM

## 2016-06-08 DIAGNOSIS — M2022 Hallux rigidus, left foot: Secondary | ICD-10-CM | POA: Diagnosis not present

## 2016-06-08 NOTE — Progress Notes (Signed)
  Subjective: Jasmine Chambers is a 66 y.o. female patient seen today in office for POV #6 (DOS 11-17-15), S/P Right youngswick bunionectomy with screw fixation. Patient denies pain at surgical site, states that she is doing better on right now getting so pain or symptoms on left, denies calf pain, denies headache, chest pain, shortness of breath, nausea, vomiting, fever, or chills; No other issues noted.   There are no active problems to display for this patient.   Current Outpatient Prescriptions on File Prior to Visit  Medication Sig Dispense Refill  . ACCU-CHEK AVIVA PLUS test strip     . ACCU-CHEK SOFTCLIX LANCETS lancets     . ARMOUR THYROID 60 MG tablet     . docusate sodium (COLACE) 100 MG capsule TAKE ONE CAPSULE EVERY 6 HOURS AS NEEDED FOR CONSTIPATION  0  . docusate sodium (COLACE) 100 MG capsule Take 100 mg by mouth daily as needed for mild constipation.    . hydrochlorothiazide (HYDRODIURIL) 25 MG tablet Take 25 mg by mouth daily.  1  . HYDROcodone-acetaminophen (NORCO) 10-325 MG tablet Take 1 tablet by mouth every 6 (six) hours as needed. for pain 30 tablet 0  . HYDROcodone-acetaminophen (NORCO) 10-325 MG tablet Take 1 tablet by mouth every 6 (six) hours as needed.    Marland Kitchen. HYDROcodone-homatropine (HYCODAN) 5-1.5 MG/5ML syrup TAKE 5 ML AT BEDTIME AS NEEDED FOR COUGH  0  . liothyronine (CYTOMEL) 25 MCG tablet     . Progesterone Micronized (PROGESTERONE, BULK,) POWD     . promethazine (PHENERGAN) 25 MG tablet TAKE 1 TABLET EVERY 8 HOURS AS NEEDED FOR NAUSEA  0  . promethazine (PHENERGAN) 25 MG tablet Take 25 mg by mouth every 8 (eight) hours as needed for nausea or vomiting.    . Testosterone Propionate POWD      No current facility-administered medications on file prior to visit.     No Known Allergies  Objective: There were no vitals filed for this visit.  General: No acute distress, AAOx3  Right foot: Incision well healed at surgical site, no swelling, no erythema, no  warmth, no drainage, no signs of infection noted, Capillary fill time <3 seconds in all digits, gross sensation present via light touch to right foot. No pain or crepitation with range of motion right foot.  No pain with calf compression.   Left foot: There is early bunion with limitus noted with no other signs of infection.   Xrays hardware intact on right, 1st MTPJ narrowing on left, no other acute findings.   Assessment and Plan:  Problem List Items Addressed This Visit    None    Visit Diagnoses    S/P foot surgery, right    -  Primary   Relevant Orders   DG Foot Complete Right (Completed)   Hallux rigidus of left foot       Relevant Orders   DG Foot Complete Left (Completed)      -Patient seen and evaluated -Continue with normal shoes -Continue with normal activities -Encouraged continued range of motion exercises of 1st MTPJ as instructed previously  -Advised close monitoring of left, if becomes more symptomatic may require surgery  -Return PRN. In the meantime, patient to call office if any issues or problems arise.   Asencion Islamitorya Tyhir Schwan, DPM

## 2016-09-21 DIAGNOSIS — R7303 Prediabetes: Secondary | ICD-10-CM | POA: Diagnosis not present

## 2016-09-21 DIAGNOSIS — Z23 Encounter for immunization: Secondary | ICD-10-CM | POA: Diagnosis not present

## 2016-09-21 DIAGNOSIS — E785 Hyperlipidemia, unspecified: Secondary | ICD-10-CM | POA: Diagnosis not present

## 2016-09-21 DIAGNOSIS — E2839 Other primary ovarian failure: Secondary | ICD-10-CM | POA: Diagnosis not present

## 2016-09-21 DIAGNOSIS — R350 Frequency of micturition: Secondary | ICD-10-CM | POA: Diagnosis not present

## 2016-09-21 DIAGNOSIS — Z0000 Encounter for general adult medical examination without abnormal findings: Secondary | ICD-10-CM | POA: Diagnosis not present

## 2016-09-21 DIAGNOSIS — Z1389 Encounter for screening for other disorder: Secondary | ICD-10-CM | POA: Diagnosis not present

## 2016-09-21 DIAGNOSIS — I10 Essential (primary) hypertension: Secondary | ICD-10-CM | POA: Diagnosis not present

## 2016-09-23 ENCOUNTER — Other Ambulatory Visit: Payer: Self-pay | Admitting: Family Medicine

## 2016-09-23 DIAGNOSIS — Z1231 Encounter for screening mammogram for malignant neoplasm of breast: Secondary | ICD-10-CM

## 2016-09-23 DIAGNOSIS — E2839 Other primary ovarian failure: Secondary | ICD-10-CM

## 2016-10-18 ENCOUNTER — Ambulatory Visit
Admission: RE | Admit: 2016-10-18 | Discharge: 2016-10-18 | Disposition: A | Discharge status: Home / Self Care | Payer: Medicare Other | Source: Ambulatory Visit | Attending: Family Medicine | Admitting: Family Medicine

## 2016-10-18 DIAGNOSIS — E2839 Other primary ovarian failure: Secondary | ICD-10-CM

## 2016-10-18 DIAGNOSIS — Z1231 Encounter for screening mammogram for malignant neoplasm of breast: Secondary | ICD-10-CM | POA: Diagnosis not present

## 2016-10-18 DIAGNOSIS — Z13820 Encounter for screening for osteoporosis: Secondary | ICD-10-CM | POA: Diagnosis not present

## 2016-10-18 DIAGNOSIS — Z780 Asymptomatic menopausal state: Secondary | ICD-10-CM | POA: Diagnosis not present

## 2017-03-09 DIAGNOSIS — Z1211 Encounter for screening for malignant neoplasm of colon: Secondary | ICD-10-CM | POA: Diagnosis not present

## 2017-10-05 ENCOUNTER — Ambulatory Visit
Admission: RE | Admit: 2017-10-05 | Discharge: 2017-10-05 | Disposition: A | Discharge status: Home / Self Care | Payer: Medicare Other | Source: Ambulatory Visit | Attending: Sports Medicine | Admitting: Sports Medicine

## 2017-10-05 ENCOUNTER — Ambulatory Visit (INDEPENDENT_AMBULATORY_CARE_PROVIDER_SITE_OTHER): Payer: Medicare Other | Admitting: Sports Medicine

## 2017-10-05 ENCOUNTER — Encounter: Payer: Self-pay | Admitting: Sports Medicine

## 2017-10-05 VITALS — BP 126/74 | Ht 66.0 in | Wt 161.0 lb

## 2017-10-05 DIAGNOSIS — M25562 Pain in left knee: Secondary | ICD-10-CM

## 2017-10-05 DIAGNOSIS — G8929 Other chronic pain: Secondary | ICD-10-CM

## 2017-10-05 DIAGNOSIS — M25561 Pain in right knee: Principal | ICD-10-CM

## 2017-10-05 DIAGNOSIS — M1712 Unilateral primary osteoarthritis, left knee: Secondary | ICD-10-CM | POA: Diagnosis not present

## 2017-10-05 DIAGNOSIS — M1711 Unilateral primary osteoarthritis, right knee: Secondary | ICD-10-CM | POA: Diagnosis not present

## 2017-10-05 NOTE — Progress Notes (Signed)
   HPI  CC: Bilateral knee pain  Jasmine Chambers is a 67 year old female who presents today for bilateral knee pain.  She states that her knees have bothered her off-and-on for the past several years.  She states that 3 weeks ago she was getting out of her car when she felt her left knee give out on her.  She states she does not recall there being much associated pain.  She states she has had no pain since that time.  He states that her left knee did give out on her another time when she was getting out of the bathtub.  She states that she felt like her knee was locking up or caught at the time.  She denies any numbness and tingling going down her leg.  She denies any weakness of her leg.  She denies any history of trauma to the knee.  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  She states she took Advil after the initial incident with some pain relief.  Past Injuries: No history of knee pain when running Past Surgeries: Right foot surgery Smoking: Non-smoker Family Hx: Noncontributory  All past medical history, medications, allergies reviewed, so at today's visit.  ROS: Per HPI; in addition no fever, no rash, no additional weakness, no additional numbness, no additional paresthesias, and no additional falls/injury.   Objective: BP 126/74   Ht 5\' 6"  (1.676 m)   Wt 161 lb (73 kg)   BMI 25.99 kg/m  Gen:  NAD, well groomed, a/o x3, normal affect.  CV: Well-perfused. Warm.  Resp: Non-labored.  Neuro: Sensation intact throughout. No gross coordination deficits.   Bilateral knee exam: No erythema, swelling, warmth noted.  Hypertrophy of the fat pad laterally both knees.  No tenderness palpation on exam.  Full range of motion knee extension.  Knee flexion limited to around 110 degrees.  Crepitus noted on range of motion.  Strength 5 out of 5 throughout all testing.  Negative Lachman test, negative posterior drawer, negative varus stress testing, negative  valgus stress testing, negative McMurray testing, negative Thessaly testing.  Assessment and Plan: 1. Bilateral knee pain, likely related to degenerative disease.  We discussed with Jasmine Chambers at today's visit that this is likely related to degenerative disease of her bilateral knees.  We will obtain x-rays at this time to assess the joint space of both knees.  Giving the locking history of her knees, there could be a degenerative meniscus in her left knee as well.  We have provided her with quad strengthening exercises at today's visit.  We will see her for follow-up in 2 to 4 weeks.  Depending on what the x-rays show, we can consider getting an MRI at that time.  I did discuss with her that surgery is usually not a great option for degenerative meniscal damage.  Jasmine Quan, MD [facility] Fellow 10/05/2017 11:34 AM

## 2017-10-17 ENCOUNTER — Telehealth: Payer: Self-pay | Admitting: *Deleted

## 2017-10-17 NOTE — Telephone Encounter (Signed)
Letter dictated for pick up

## 2017-12-09 DIAGNOSIS — Z23 Encounter for immunization: Secondary | ICD-10-CM | POA: Diagnosis not present

## 2017-12-20 DIAGNOSIS — E785 Hyperlipidemia, unspecified: Secondary | ICD-10-CM | POA: Diagnosis not present

## 2017-12-20 DIAGNOSIS — Z1389 Encounter for screening for other disorder: Secondary | ICD-10-CM | POA: Diagnosis not present

## 2017-12-20 DIAGNOSIS — I10 Essential (primary) hypertension: Secondary | ICD-10-CM | POA: Diagnosis not present

## 2017-12-20 DIAGNOSIS — R7303 Prediabetes: Secondary | ICD-10-CM | POA: Diagnosis not present

## 2018-03-28 ENCOUNTER — Ambulatory Visit: Payer: Medicare Other | Admitting: Sports Medicine

## 2018-05-02 ENCOUNTER — Ambulatory Visit (INDEPENDENT_AMBULATORY_CARE_PROVIDER_SITE_OTHER): Payer: Medicare Other | Admitting: Sports Medicine

## 2018-05-02 ENCOUNTER — Other Ambulatory Visit: Payer: Self-pay | Admitting: Sports Medicine

## 2018-05-02 ENCOUNTER — Other Ambulatory Visit: Payer: Self-pay

## 2018-05-02 ENCOUNTER — Encounter: Payer: Self-pay | Admitting: Sports Medicine

## 2018-05-02 ENCOUNTER — Ambulatory Visit (INDEPENDENT_AMBULATORY_CARE_PROVIDER_SITE_OTHER): Payer: Medicare Other

## 2018-05-02 VITALS — Temp 97.5°F

## 2018-05-02 DIAGNOSIS — M2022 Hallux rigidus, left foot: Secondary | ICD-10-CM | POA: Diagnosis not present

## 2018-05-02 DIAGNOSIS — M79672 Pain in left foot: Secondary | ICD-10-CM

## 2018-05-02 DIAGNOSIS — M2012 Hallux valgus (acquired), left foot: Secondary | ICD-10-CM

## 2018-05-02 MED ORDER — MELOXICAM 15 MG PO TABS: 15.0000 mg | ORAL_TABLET | Freq: Every day | ORAL | 0 refills | Status: DC

## 2018-05-02 NOTE — Progress Notes (Signed)
Subjective: Jasmine Chambers is a 68 y.o. female patient who presents to office for evaluation of left big toe joint pain. Patient complains of progressive pain especially over the last year in the left toe joint that is painful with pressure or sheets touching or putting pressure and with certain activities. Ranks pain 3/10 and is now interferring with daily activities a little.  Patient has also tried changing shoes. Patient denies any other pedal complaints.   There are no active problems to display for this patient.   Current Outpatient Medications on File Prior to Visit  Medication Sig Dispense Refill  . hydrochlorothiazide (HYDRODIURIL) 25 MG tablet Take 25 mg by mouth daily.  1  . Multiple Vitamin (MULTIVITAMIN PO) Take 1 tablet by mouth daily.     No current facility-administered medications on file prior to visit.     No Known Allergies  Objective:  General: Alert and oriented x3 in no acute distress  Dermatology: No open lesions bilateral lower extremities, no webspace macerations, no ecchymosis bilateral, Scar at right 1st MTPJ from previous surgery. All nails x 10 are well manicured.  Vascular: Dorsalis Pedis and Posterior Tibial pedal pulses 1/4, Capillary Fill Time 3 seconds, (+) pedal hair growth bilateral, no edema bilateral lower extremities, Temperature gradient within normal limits.  Neurology: Gross sensation intact via light touch bilateral.  Musculoskeletal: Mild tenderness with palpation left 1st MTPJ and bony deformity, + limitation 45 df and 10 pf with range of motion.  Strength WNL bilateral.  Xrays  Left Foot    Impression: 1st MTPJ joint space narrowing and small spur with elevatus, no other acute findings.       Assessment and Plan: Problem List Items Addressed This Visit    None    Visit Diagnoses    Left foot pain    -  Primary   Hallux rigidus of left foot           -Complete examination performed -Xrays reviewed -Discussed treatement options;  discussed Hallux rigidus deformity;conservative and  Surgical management; risks, benefits, alternatives discussed. All patient's questions answered. -Rx Mobic -Rx Custom insoles office to call to discuss coverage  -Recommend continue with good supportive shoes and advised patient that if pain is not improved may benefit from injection vs surgery -Patient to return to office for othrotics or sooner if condition worsens.  Asencion Islam, DPM

## 2018-05-02 NOTE — Patient Instructions (Signed)
Bunion  A bunion is a bump on the base of the big toe that forms when the bones of the big toe joint move out of position. Bunions may be small at first, but they often get larger over time. They can make walking painful. What are the causes? A bunion may be caused by:  Wearing narrow or pointed shoes that force the big toe to press against the other toes.  Abnormal foot development that causes the foot to roll inward (pronate).  Changes in the foot that are caused by certain diseases, such as rheumatoid arthritis or polio.  A foot injury. What increases the risk? The following factors may make you more likely to develop this condition:  Wearing shoes that squeeze the toes together.  Having certain diseases, such as: ? Rheumatoid arthritis. ? Polio. ? Cerebral palsy.  Having family members who have bunions.  Being born with a foot deformity, such as flat feet or low arches.  Doing activities that put a lot of pressure on the feet, such as ballet dancing. What are the signs or symptoms? The main symptom of a bunion is a noticeable bump on the big toe. Other symptoms may include:  Pain.  Swelling around the big toe.  Redness and inflammation.  Thick or hardened skin on the big toe or between the toes.  Stiffness or loss of motion in the big toe.  Trouble with walking. How is this diagnosed? A bunion may be diagnosed based on your symptoms, medical history, and activities. You may have tests, such as:  X-rays. These allow your health care provider to check the position of the bones in your foot and look for damage to your joint. They also help your health care provider determine the severity of your bunion and the best way to treat it.  Joint aspiration. In this test, a sample of fluid is removed from the toe joint. This test may be done if you are in a lot of pain. It helps rule out diseases that cause painful swelling of the joints, such as arthritis. How is this  treated? Treatment depends on the severity of your symptoms. The goal of treatment is to relieve symptoms and prevent the bunion from getting worse. Your health care provider may recommend:  Wearing shoes that have a wide toe box.  Using bunion pads to cushion the affected area.  Taping your toes together to keep them in a normal position.  Placing a device inside your shoe (orthotics) to help reduce pressure on your toe joint.  Taking medicine to ease pain, inflammation, and swelling.  Applying heat or ice to the affected area.  Doing stretching exercises.  Surgery to remove scar tissue and move the toes back into their normal position. This treatment is rare. Follow these instructions at home: Managing pain, stiffness, and swelling   If directed, put ice on the painful area: ? Put ice in a plastic bag. ? Place a towel between your skin and the bag. ? Leave the ice on for 20 minutes, 2-3 times a day. Activity   If directed, apply heat to the affected area before you exercise. Use the heat source that your health care provider recommends, such as a moist heat pack or a heating pad. ? Place a towel between your skin and the heat source. ? Leave the heat on for 20-30 minutes. ? Remove the heat if your skin turns bright red. This is especially important if you are unable to feel pain,   heat, or cold. You may have a greater risk of getting burned.  Do exercises as told by your health care provider. General instructions  Support your toe joint with proper footwear, shoe padding, or taping as told by your health care provider.  Take over-the-counter and prescription medicines only as told by your health care provider.  Keep all follow-up visits as told by your health care provider. This is important. Contact a health care provider if your symptoms:  Get worse.  Do not improve in 2 weeks. Get help right away if you have:  Severe pain and trouble with walking. Summary  A  bunion is a bump on the base of the big toe that forms when the bones of the big toe joint move out of position.  Bunions can make walking painful.  Treatment depends on the severity of your symptoms.  Support your toe joint with proper footwear, shoe padding, or taping as told by your health care provider. This information is not intended to replace advice given to you by your health care provider. Make sure you discuss any questions you have with your health care provider. Document Released: 12/21/2004 Document Revised: 05/03/2017 Document Reviewed: 05/03/2017 Elsevier Interactive Patient Education  2019 Elsevier Inc.  

## 2018-05-16 ENCOUNTER — Ambulatory Visit (INDEPENDENT_AMBULATORY_CARE_PROVIDER_SITE_OTHER): Payer: Medicare Other | Admitting: Orthotics

## 2018-05-16 ENCOUNTER — Other Ambulatory Visit: Payer: Self-pay

## 2018-05-16 DIAGNOSIS — M2022 Hallux rigidus, left foot: Secondary | ICD-10-CM

## 2018-05-16 DIAGNOSIS — M79672 Pain in left foot: Secondary | ICD-10-CM

## 2018-05-16 NOTE — Progress Notes (Signed)
Patient is here today to be evaluated and cast for CMFO.  Patient has hx of functional hallux limitus (FHL), and needs a supportive orthoses that will plantarflex first ray in order to lower hinge pin of first MPJ and enhance windless effect.  Plan of deep heel cup, hug arch, and reverse mortons extension.  Richy to fab.  

## 2018-05-31 ENCOUNTER — Ambulatory Visit (INDEPENDENT_AMBULATORY_CARE_PROVIDER_SITE_OTHER): Payer: Self-pay | Admitting: Orthotics

## 2018-05-31 ENCOUNTER — Other Ambulatory Visit: Payer: Self-pay

## 2018-05-31 DIAGNOSIS — M2022 Hallux rigidus, left foot: Secondary | ICD-10-CM

## 2018-05-31 DIAGNOSIS — M79672 Pain in left foot: Secondary | ICD-10-CM

## 2018-05-31 NOTE — Progress Notes (Signed)
Patient came in today to pick up custom made foot orthotics.  The goals were accomplished and the patient reported no dissatisfaction with said orthotics.  Patient was advised of breakin period and how to report any issues. 

## 2018-06-05 ENCOUNTER — Telehealth: Payer: Self-pay | Admitting: *Deleted

## 2018-06-05 MED ORDER — MELOXICAM 15 MG PO TABS: 15.0000 mg | ORAL_TABLET | Freq: Every day | ORAL | 0 refills | Status: DC

## 2018-06-05 NOTE — Telephone Encounter (Signed)
Pt requested to have #90 Meloxicam.

## 2018-06-05 NOTE — Telephone Encounter (Signed)
I reviewed pt's clinicals and Dr. Marylene Land had wanted to see pt if she was continuing the have a problem. I informed pt of Dr. Wynema Birch statement and that I could refill for another #30 to get her to the appt date. Pt agreed and I transferred to schedulers.

## 2018-06-06 ENCOUNTER — Ambulatory Visit (INDEPENDENT_AMBULATORY_CARE_PROVIDER_SITE_OTHER): Payer: Medicare Other | Admitting: Sports Medicine

## 2018-06-06 ENCOUNTER — Encounter: Payer: Self-pay | Admitting: Sports Medicine

## 2018-06-06 ENCOUNTER — Other Ambulatory Visit: Payer: Self-pay

## 2018-06-06 DIAGNOSIS — M79672 Pain in left foot: Secondary | ICD-10-CM

## 2018-06-06 DIAGNOSIS — M2022 Hallux rigidus, left foot: Secondary | ICD-10-CM

## 2018-06-06 NOTE — Progress Notes (Signed)
Subjective: Jasmine Chambers is a 68 y.o. female patient who returns to office for orthotic check. Devices were dispensed last week and patient reports that she is getting use to them. Admits that she also has issues with her knees as well but otherwise is doing good with Mobic. Reports that she is interested in getting a second pair of orthotics that may fit in her other shoes like her TOMS. Patient denies any other pedal complaints.   There are no active problems to display for this patient.   Current Outpatient Medications on File Prior to Visit  Medication Sig Dispense Refill  . hydrochlorothiazide (HYDRODIURIL) 25 MG tablet Take 25 mg by mouth daily.  1  . meloxicam (MOBIC) 15 MG tablet Take 1 tablet (15 mg total) by mouth daily. 30 tablet 0  . Multiple Vitamin (MULTIVITAMIN PO) Take 1 tablet by mouth daily.     No current facility-administered medications on file prior to visit.     No Known Allergies  Objective:  General: Alert and oriented x3 in no acute distress   Unchanged exam  Dermatology: No open lesions bilateral lower extremities, no webspace macerations, no ecchymosis bilateral, Scar at right 1st MTPJ from previous surgery. All nails x 10 are well manicured.  Vascular: Dorsalis Pedis and Posterior Tibial pedal pulses 1/4, Capillary Fill Time 3 seconds, (+) pedal hair growth bilateral, no edema bilateral lower extremities, Temperature gradient within normal limits.  Neurology: Gross sensation intact via light touch bilateral.  Musculoskeletal: Minimal tenderness with palpation left 1st MTPJ and bony deformity, + limitation 45 df and 10 pf with range of motion.  Strength WNL bilateral.       Assessment and Plan: Problem List Items Addressed This Visit    None    Visit Diagnoses    Hallux rigidus of left foot    -  Primary   Left foot pain           -Exam performed -Orthotic appear to be fitting nicely providing support and offlaoding -Recommend continue with good  supportive shoes  -Continue Mobic PRN -Advised Ortho f/u for her knees  -Patient to return to office for 2nd pair of 3/4 length orthotics for her dress shoes when ready or sooner if condition worsens.  Asencion Islam, DPM

## 2018-07-03 ENCOUNTER — Other Ambulatory Visit: Payer: Self-pay | Admitting: Sports Medicine

## 2018-07-04 NOTE — Telephone Encounter (Signed)
Please advise 

## 2018-07-11 DIAGNOSIS — Z1389 Encounter for screening for other disorder: Secondary | ICD-10-CM | POA: Diagnosis not present

## 2018-07-11 DIAGNOSIS — R7303 Prediabetes: Secondary | ICD-10-CM | POA: Diagnosis not present

## 2018-07-11 DIAGNOSIS — E785 Hyperlipidemia, unspecified: Secondary | ICD-10-CM | POA: Diagnosis not present

## 2018-07-11 DIAGNOSIS — Z0000 Encounter for general adult medical examination without abnormal findings: Secondary | ICD-10-CM | POA: Diagnosis not present

## 2018-07-11 DIAGNOSIS — Z1211 Encounter for screening for malignant neoplasm of colon: Secondary | ICD-10-CM | POA: Diagnosis not present

## 2018-07-11 DIAGNOSIS — I10 Essential (primary) hypertension: Secondary | ICD-10-CM | POA: Diagnosis not present

## 2018-07-12 DIAGNOSIS — R7303 Prediabetes: Secondary | ICD-10-CM | POA: Diagnosis not present

## 2018-07-12 DIAGNOSIS — I10 Essential (primary) hypertension: Secondary | ICD-10-CM | POA: Diagnosis not present

## 2018-07-12 DIAGNOSIS — E785 Hyperlipidemia, unspecified: Secondary | ICD-10-CM | POA: Diagnosis not present

## 2018-07-12 DIAGNOSIS — Z1211 Encounter for screening for malignant neoplasm of colon: Secondary | ICD-10-CM | POA: Diagnosis not present

## 2018-08-15 ENCOUNTER — Telehealth: Payer: Self-pay | Admitting: Sports Medicine

## 2018-08-15 NOTE — Telephone Encounter (Signed)
Pt left message asking for a call back about orthotics for a different shoe.  I returned call and left message for pt to call me back to see how I can assist her.

## 2018-08-15 NOTE — Telephone Encounter (Signed)
Pt called back and wants inserts for a more narrow shoe and said the ones she got was for her toe.  I explained that if we get the dress style that it would not go to the toe but I would tell Liliane Channel and have him call pt. She wants it for a pair of tom's of some sort.

## 2018-08-18 DIAGNOSIS — Z1211 Encounter for screening for malignant neoplasm of colon: Secondary | ICD-10-CM | POA: Diagnosis not present

## 2018-08-22 ENCOUNTER — Telehealth: Payer: Self-pay | Admitting: Sports Medicine

## 2018-08-22 NOTE — Telephone Encounter (Signed)
Pt called asking if she could get the orthotics redone because they are too short for her shoes. She said her toe hangs off and causes pain in her gym shoes.   I told pt I would discuss with you when you are back in the office next week and call her back.

## 2018-09-01 ENCOUNTER — Telehealth: Payer: Self-pay | Admitting: Sports Medicine

## 2018-09-01 NOTE — Telephone Encounter (Signed)
Pt called and is asking to order a second pair of orthotics and is aware they are 199.00 within 6 months. She said she will bring the old pair in to be fixed when the 2nd pair comes in.

## 2018-09-18 ENCOUNTER — Telehealth: Payer: Self-pay | Admitting: Sports Medicine

## 2018-09-18 NOTE — Telephone Encounter (Signed)
Pt left message stating she called  A while back and was awaiting a call back.  I returned call and it looks like a second pair of orthotics were ordered and are shipping this week so I should have them next week.  Pt thought someone was going to call her before they were ordered. But I am not sure what happened but Liliane Channel did order the 2nd pair and I will call when they come in.

## 2018-09-26 ENCOUNTER — Other Ambulatory Visit: Payer: Self-pay

## 2018-09-26 ENCOUNTER — Ambulatory Visit: Payer: Medicare Other | Admitting: Orthotics

## 2018-09-26 DIAGNOSIS — M2022 Hallux rigidus, left foot: Secondary | ICD-10-CM

## 2018-09-26 NOTE — Progress Notes (Signed)
F/o topcover too short; needs to be ladies 11.5 even though she is a 10.  [employer] told her she needed the larger size.

## 2018-10-09 ENCOUNTER — Other Ambulatory Visit: Payer: Self-pay

## 2018-10-09 ENCOUNTER — Ambulatory Visit (INDEPENDENT_AMBULATORY_CARE_PROVIDER_SITE_OTHER): Payer: Medicare Other | Admitting: Orthotics

## 2018-10-09 DIAGNOSIS — M2022 Hallux rigidus, left foot: Secondary | ICD-10-CM

## 2018-10-09 DIAGNOSIS — M79672 Pain in left foot: Secondary | ICD-10-CM

## 2018-10-09 NOTE — Progress Notes (Signed)
Picked up corrected f/o;

## 2018-10-12 DIAGNOSIS — Z23 Encounter for immunization: Secondary | ICD-10-CM | POA: Diagnosis not present

## 2019-02-25 ENCOUNTER — Ambulatory Visit: Payer: Medicare Other | Attending: Internal Medicine

## 2019-02-25 DIAGNOSIS — Z23 Encounter for immunization: Secondary | ICD-10-CM | POA: Insufficient documentation

## 2019-02-25 NOTE — Progress Notes (Signed)
   Covid-19 Vaccination Clinic  Name:  VICKEE MORMINO    MRN: [id number] DOB: 1950-12-06  02/25/2019  Ms. Hammersmith was observed post Covid-19 immunization for 15 minutes without incidence. She was provided with Vaccine Information Sheet and instruction to access the V-Safe system.   Ms. Simeone was instructed to call 911 with any severe reactions post vaccine: Marland Kitchen Difficulty breathing  . Swelling of your face and throat  . A fast heartbeat  . A bad rash all over your body  . Dizziness and weakness    Immunizations Administered    Name Date Dose VIS Date Route   Pfizer COVID-19 Vaccine 02/25/2019  1:58 PM 0.3 mL 12/15/2018 Intramuscular   Manufacturer: [employer], [employer]   Lot: [id number]   NDC: [address]-9

## 2019-03-21 ENCOUNTER — Ambulatory Visit: Payer: Medicare Other

## 2019-03-21 ENCOUNTER — Ambulatory Visit: Payer: Medicare Other | Attending: Internal Medicine

## 2019-03-21 DIAGNOSIS — Z23 Encounter for immunization: Secondary | ICD-10-CM

## 2019-03-21 NOTE — Progress Notes (Signed)
   Covid-19 Vaccination Clinic  Name:  Jasmine Chambers    MRN: [id number] DOB: [birth year]  03/21/2019  Jasmine Chambers was observed post Covid-19 immunization for 15 minutes without incident. She was provided with Vaccine Information Sheet and instruction to access the V-Safe system.   Jasmine Chambers was instructed to call 911 with any severe reactions post vaccine: Marland Kitchen Difficulty breathing  . Swelling of face and throat  . A fast heartbeat  . A bad rash all over body  . Dizziness and weakness   Immunizations Administered    Name Date Dose VIS Date Route   Pfizer COVID-19 Vaccine 03/21/2019  9:47 AM 0.3 mL 12/15/2018 Intramuscular   Manufacturer: [employer], [employer]   Lot: [id number]   NDC: [address]-3

## 2019-04-23 ENCOUNTER — Telehealth: Payer: Self-pay | Admitting: Sports Medicine

## 2019-04-23 NOTE — Telephone Encounter (Signed)
Pt left message for me to call her back..   I returned call and left message for pt to call me back.

## 2019-04-23 NOTE — Telephone Encounter (Signed)
Pt returned call and is wanting a second pair of orthotics. This pair she would like a dress pair. She is wanting to see Betha. I have scheduled pt to see Betha on 4.30.2021.Marland KitchenMarland Kitchen

## 2019-05-04 ENCOUNTER — Other Ambulatory Visit: Payer: Medicare Other | Admitting: Orthotics

## 2019-05-04 ENCOUNTER — Other Ambulatory Visit: Payer: Self-pay

## 2019-05-04 DIAGNOSIS — M2022 Hallux rigidus, left foot: Secondary | ICD-10-CM

## 2019-06-09 IMAGING — DX DG KNEE COMPLETE 4+V*L*
4 series · 4 of 4 positions shown · non-contrast
Comparison: None.

CLINICAL DATA: Bilateral knee pain.

EXAM:
LEFT KNEE - COMPLETE 4+ VIEW

[dg knee complete 4 views left (1 of 4)]
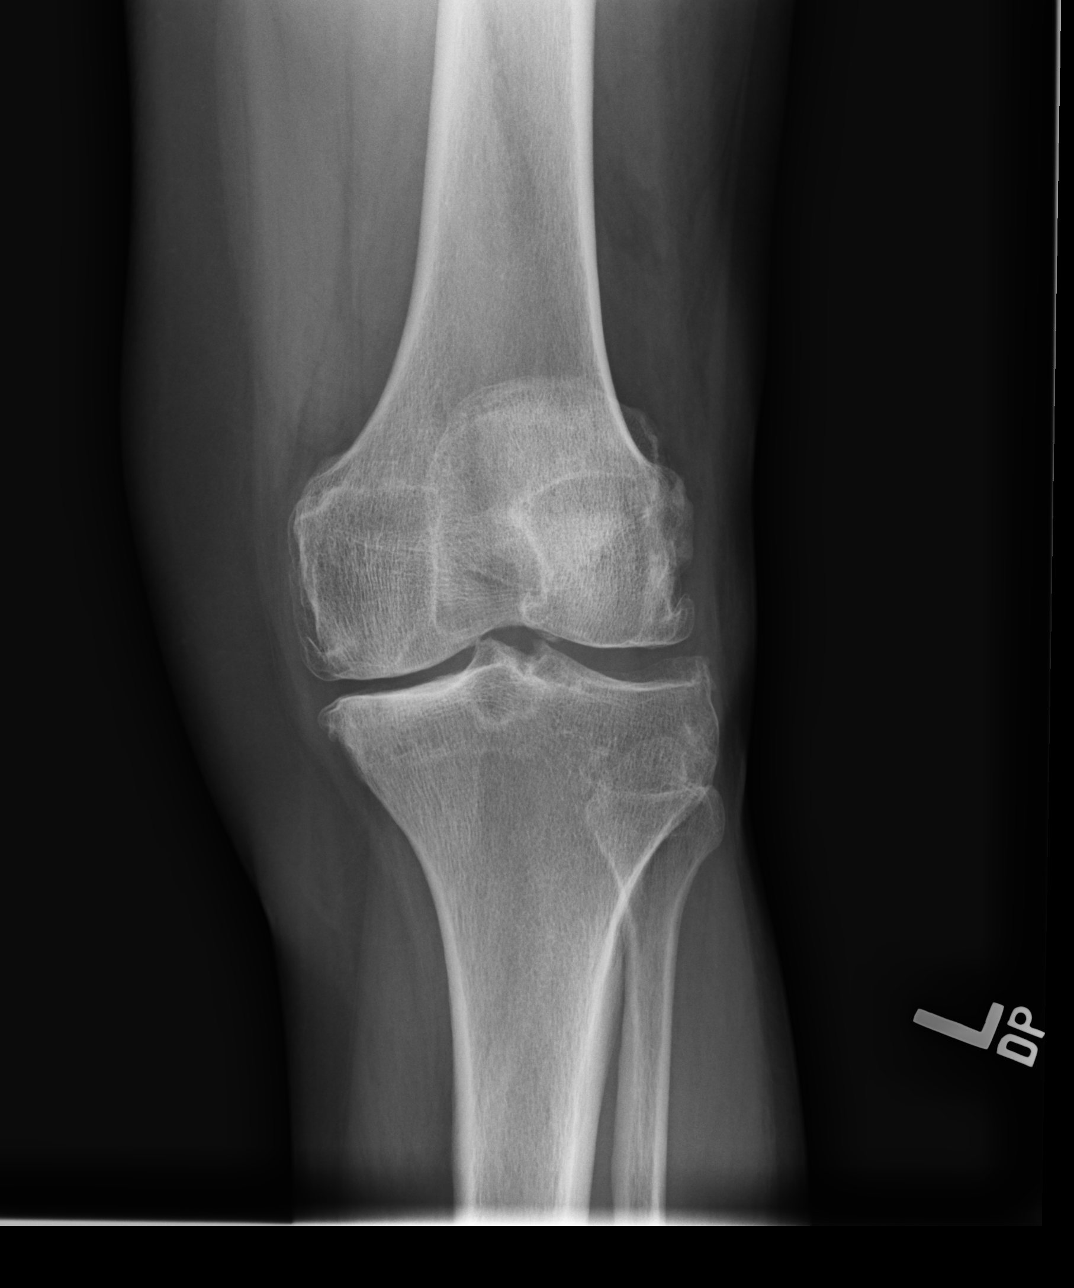

[dg knee complete 4 views left (2 of 4)]
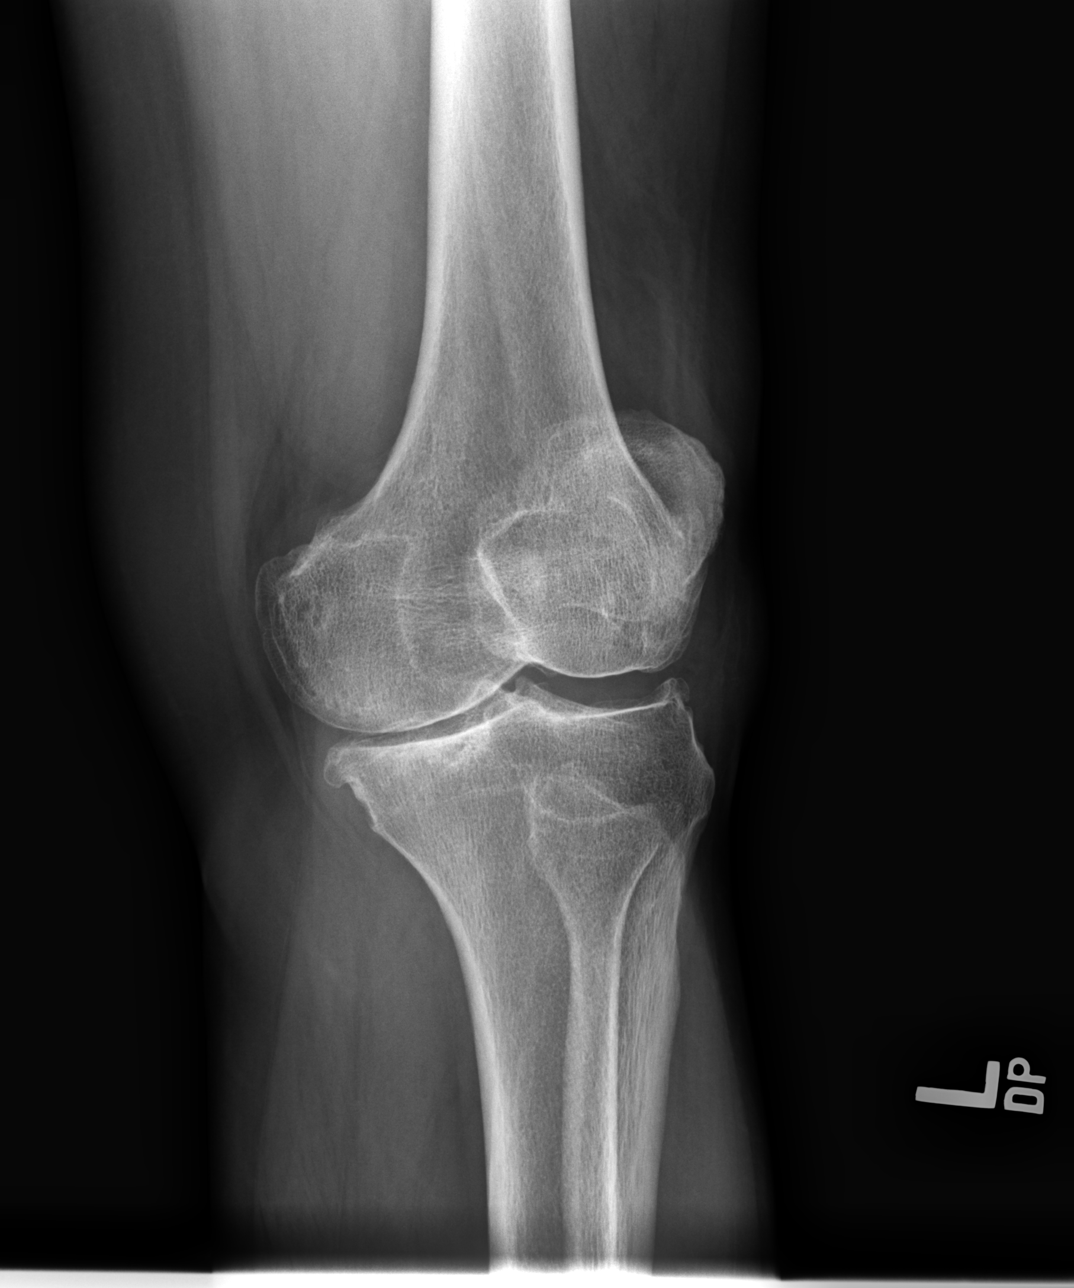

[dg knee complete 4 views left (3 of 4)]
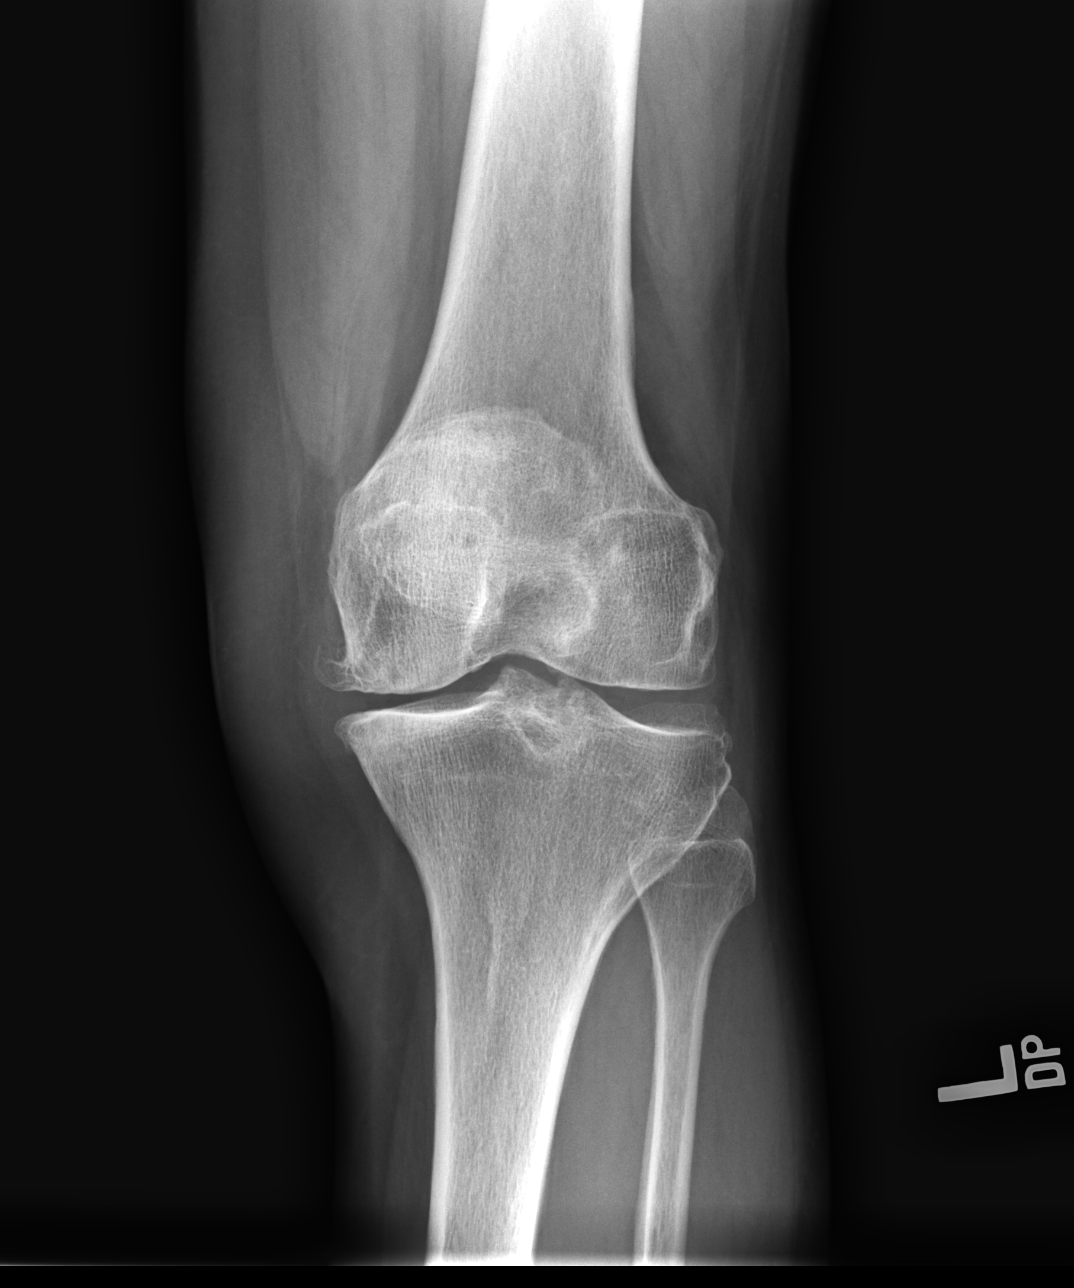

[dg knee complete 4 views left (4 of 4)]
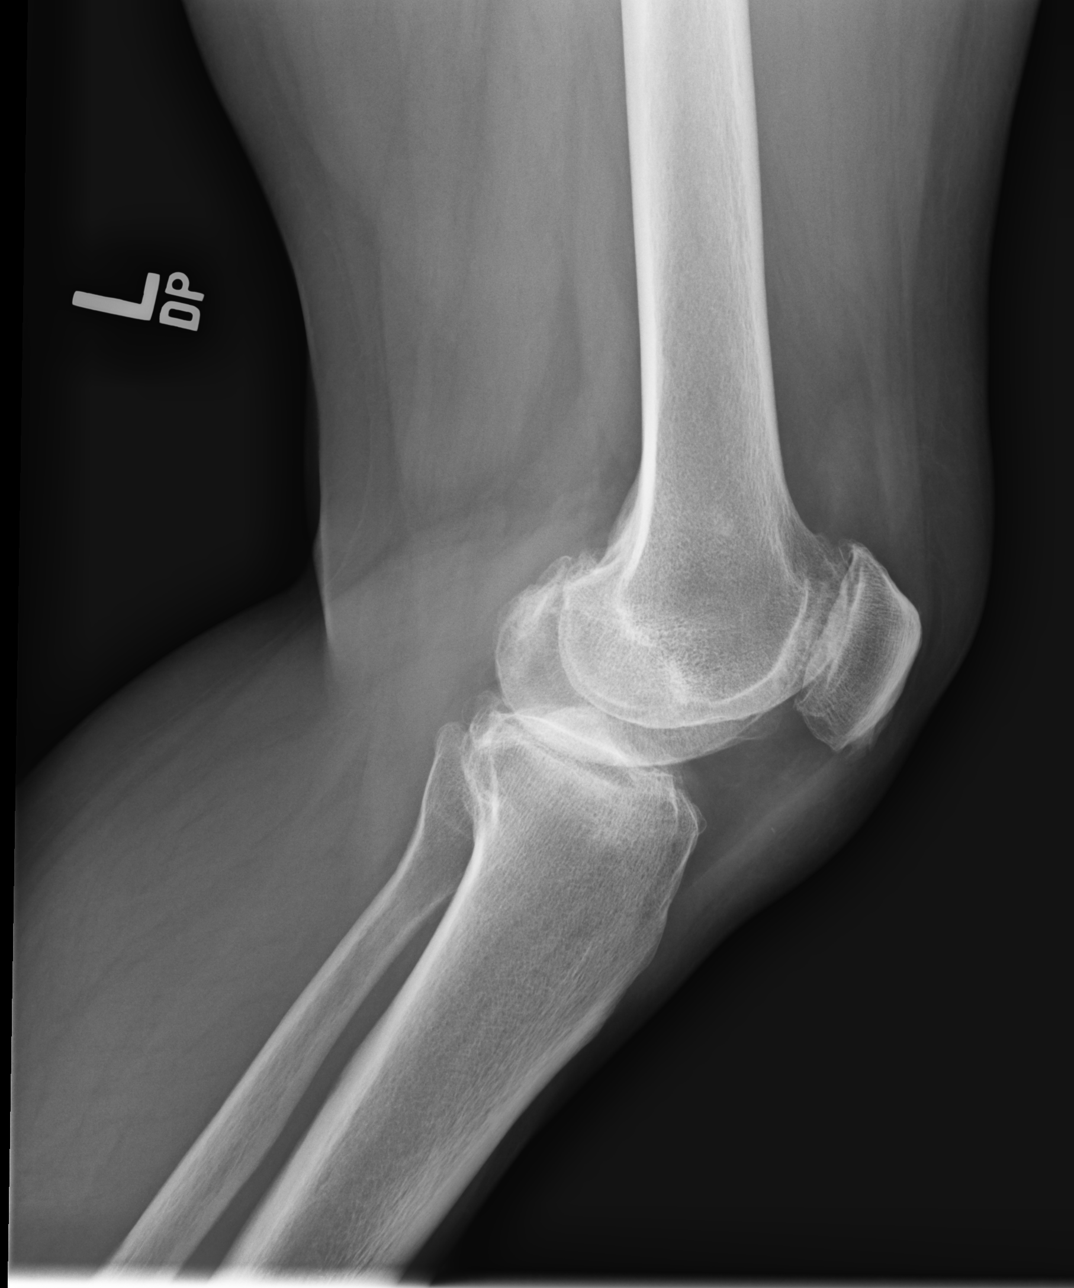

[4 of 4 positions shown; findings below may reference images not displayed]

FINDINGS: Medial compartment joint space narrowing. Medial and lateral
marginal osteophytes. Osteoarthritis also of the patellofemoral
joint. Small knee joint effusion. No loose body is seen.
IMPRESSION: Tricompartmental osteoarthritis, most pronounced in the medial
compartment and patellofemoral joint.

## 2019-12-13 ENCOUNTER — Other Ambulatory Visit: Payer: Self-pay | Admitting: Family Medicine

## 2019-12-13 DIAGNOSIS — Z1231 Encounter for screening mammogram for malignant neoplasm of breast: Secondary | ICD-10-CM

## 2020-01-24 ENCOUNTER — Ambulatory Visit: Payer: Medicare Other

## 2020-02-13 ENCOUNTER — Ambulatory Visit: Payer: Medicare Other

## 2020-05-20 ENCOUNTER — Ambulatory Visit: Payer: Medicare Other | Attending: Internal Medicine

## 2020-05-20 ENCOUNTER — Ambulatory Visit (HOSPITAL_BASED_OUTPATIENT_CLINIC_OR_DEPARTMENT_OTHER)
Admission: RE | Admit: 2020-05-20 | Discharge: 2020-05-20 | Disposition: A | Discharge status: Home / Self Care | Payer: Medicare Other | Source: Ambulatory Visit | Attending: Family Medicine | Admitting: Family Medicine

## 2020-05-20 ENCOUNTER — Other Ambulatory Visit: Payer: Self-pay

## 2020-05-20 ENCOUNTER — Other Ambulatory Visit (HOSPITAL_BASED_OUTPATIENT_CLINIC_OR_DEPARTMENT_OTHER): Payer: Self-pay

## 2020-05-20 DIAGNOSIS — Z1231 Encounter for screening mammogram for malignant neoplasm of breast: Secondary | ICD-10-CM | POA: Insufficient documentation

## 2020-05-20 DIAGNOSIS — Z23 Encounter for immunization: Secondary | ICD-10-CM

## 2020-05-20 MED ORDER — PFIZER-BIONT COVID-19 VAC-TRIS 30 MCG/0.3ML IM SUSP
INTRAMUSCULAR | 0 refills | Status: AC
Start: 2020-05-20 — End: ?
  Filled 2020-05-20: qty 0.3, 1d supply, fill #0

## 2020-11-07 ENCOUNTER — Ambulatory Visit: Payer: Medicare Other | Attending: Internal Medicine

## 2020-11-07 ENCOUNTER — Other Ambulatory Visit (HOSPITAL_BASED_OUTPATIENT_CLINIC_OR_DEPARTMENT_OTHER): Payer: Self-pay

## 2020-11-07 ENCOUNTER — Other Ambulatory Visit: Payer: Self-pay

## 2020-11-07 DIAGNOSIS — Z23 Encounter for immunization: Secondary | ICD-10-CM

## 2020-11-07 MED ORDER — FLUAD QUADRIVALENT 0.5 ML IM PRSY
PREFILLED_SYRINGE | INTRAMUSCULAR | 0 refills | Status: AC
  Filled 2020-11-07: qty 0.5, 1d supply, fill #0

## 2020-11-07 MED ORDER — PFIZER COVID-19 VAC BIVALENT 30 MCG/0.3ML IM SUSP
INTRAMUSCULAR | 0 refills | Status: AC
  Filled 2020-11-07: qty 0.3, 1d supply, fill #0

## 2020-11-07 NOTE — Progress Notes (Signed)
   Covid-19 Vaccination Clinic  Name:  ANDIE MUNGIN    MRN: [id number] DOB: 07-08-1950  11/07/2020  Ms. Monsivais was observed post Covid-19 immunization for 15 minutes without incident. She was provided with Vaccine Information Sheet and instruction to access the V-Safe system.   Ms. Liz was instructed to call 911 with any severe reactions post vaccine: Difficulty breathing  Swelling of face and throat  A fast heartbeat  A bad rash all over body  Dizziness and weakness   Immunizations Administered     Name Date Dose VIS Date Route   Pfizer Covid-19 Vaccine Bivalent Booster 11/07/2020  2:33 PM 0.3 mL 09/03/2020 Intramuscular   Manufacturer: [employer], [employer]   Lot: [id number]   NDC: 5165611728

## 2020-11-10 ENCOUNTER — Other Ambulatory Visit (HOSPITAL_BASED_OUTPATIENT_CLINIC_OR_DEPARTMENT_OTHER): Payer: Self-pay

## 2020-11-10 MED ORDER — INFLUENZA VAC SPLIT QUAD 0.5 ML IM SUSY
PREFILLED_SYRINGE | INTRAMUSCULAR | 0 refills | Status: AC
Start: 2020-11-07 — End: ?
  Filled 2020-11-10: qty 0.5, 1d supply, fill #0

## 2020-11-26 ENCOUNTER — Other Ambulatory Visit (HOSPITAL_BASED_OUTPATIENT_CLINIC_OR_DEPARTMENT_OTHER): Payer: Self-pay

## 2021-12-17 ENCOUNTER — Other Ambulatory Visit: Payer: Self-pay | Admitting: Family Medicine

## 2021-12-17 DIAGNOSIS — E2839 Other primary ovarian failure: Secondary | ICD-10-CM

## 2021-12-30 ENCOUNTER — Ambulatory Visit (HOSPITAL_BASED_OUTPATIENT_CLINIC_OR_DEPARTMENT_OTHER)
Admission: RE | Admit: 2021-12-30 | Discharge: 2021-12-30 | Disposition: A | Discharge status: Home / Self Care | Payer: Medicare Other | Source: Ambulatory Visit | Attending: Family Medicine | Admitting: Family Medicine

## 2021-12-30 DIAGNOSIS — E2839 Other primary ovarian failure: Secondary | ICD-10-CM | POA: Insufficient documentation

## 2022-02-01 ENCOUNTER — Other Ambulatory Visit (HOSPITAL_BASED_OUTPATIENT_CLINIC_OR_DEPARTMENT_OTHER): Payer: Self-pay

## 2022-02-01 MED ORDER — COMIRNATY 30 MCG/0.3ML IM SUSY
PREFILLED_SYRINGE | INTRAMUSCULAR | 0 refills | Status: AC
  Filled 2022-02-01: qty 0.3, 1d supply, fill #0

## 2022-11-22 ENCOUNTER — Other Ambulatory Visit (HOSPITAL_BASED_OUTPATIENT_CLINIC_OR_DEPARTMENT_OTHER): Payer: Self-pay | Admitting: Family Medicine

## 2022-11-22 DIAGNOSIS — Z1231 Encounter for screening mammogram for malignant neoplasm of breast: Secondary | ICD-10-CM

## 2022-11-26 ENCOUNTER — Ambulatory Visit (HOSPITAL_BASED_OUTPATIENT_CLINIC_OR_DEPARTMENT_OTHER)
Admission: RE | Admit: 2022-11-26 | Discharge: 2022-11-26 | Disposition: A | Discharge status: Home / Self Care | Payer: Medicare Other | Source: Ambulatory Visit | Attending: Family Medicine | Admitting: Family Medicine

## 2022-11-26 ENCOUNTER — Other Ambulatory Visit (HOSPITAL_BASED_OUTPATIENT_CLINIC_OR_DEPARTMENT_OTHER): Payer: Self-pay

## 2022-11-26 ENCOUNTER — Encounter (HOSPITAL_BASED_OUTPATIENT_CLINIC_OR_DEPARTMENT_OTHER): Payer: Self-pay | Admitting: Radiology

## 2022-11-26 DIAGNOSIS — Z1231 Encounter for screening mammogram for malignant neoplasm of breast: Secondary | ICD-10-CM | POA: Diagnosis present

## 2022-11-26 MED ORDER — COMIRNATY 30 MCG/0.3ML IM SUSY
0.3000 mL | PREFILLED_SYRINGE | Freq: Once | INTRAMUSCULAR | 0 refills | Status: AC
  Filled 2022-11-26 (×2): qty 0.3, 1d supply, fill #0

## 2022-11-26 MED ORDER — INFLUENZA VAC A&B SURF ANT ADJ 0.5 ML IM SUSY
0.5000 mL | PREFILLED_SYRINGE | Freq: Once | INTRAMUSCULAR | 0 refills | Status: AC
  Filled 2022-11-26 (×2): qty 0.5, 1d supply, fill #0

## 2023-10-13 ENCOUNTER — Other Ambulatory Visit: Payer: Self-pay

## 2023-10-13 ENCOUNTER — Ambulatory Visit

## 2023-10-13 VITALS — BP 108/80 | Ht 66.0 in | Wt 162.0 lb

## 2023-10-13 DIAGNOSIS — M25461 Effusion, right knee: Secondary | ICD-10-CM

## 2023-10-13 DIAGNOSIS — M170 Bilateral primary osteoarthritis of knee: Secondary | ICD-10-CM

## 2023-10-13 DIAGNOSIS — M25462 Effusion, left knee: Secondary | ICD-10-CM | POA: Diagnosis not present

## 2023-10-14 NOTE — Progress Notes (Signed)
 PCP: Sun, Vyvyan, MD  Subjective:   HPI: Patient is a 73 y.o. female here for evaluation of bilateral asymptomatic anterior knee swelling.  Patient denies any recent knee injury or knee pain.  Denies any fevers or chills.  Denies any warmth or erythema surrounding her knees.  Says over the last couple years she has noticed progressive puffiness surrounding anterior of both knees.  Here today seeking evaluation for what the cause may be and wants to know whether it is concerning or not.  On further evaluation, patient does take preventative steps to prevent flareup of knee arthritis by states she tries not to walk for prolonged distances on concrete and sometimes for walking barefoot or highly cushioned shoes to help decrease pressure on knees.  Does endorse that these preventative activities limit her from dealing with any knee pain on a frequent basis.  No past medical history on file.  Current Outpatient Medications on File Prior to Visit  Medication Sig Dispense Refill   COVID-19 mRNA bivalent vaccine, Pfizer, (PFIZER COVID-19 VAC BIVALENT) injection Inject into the muscle. 0.3 mL 0   COVID-19 mRNA Vac-TriS, Pfizer, (PFIZER-BIONT COVID-19 VAC-TRIS) SUSP injection Inject into the muscle. 0.3 mL 0   COVID-19 mRNA vaccine 2023-2024 (COMIRNATY ) syringe Inject into the muscle. 0.3 mL 0   hydrochlorothiazide (HYDRODIURIL) 25 MG tablet Take 25 mg by mouth daily.  1   influenza vac split quadrivalent PF (FLUARIX) 0.5 ML injection Inject into the muscle. 0.5 mL 0   influenza vaccine adjuvanted (FLUAD  QUADRIVALENT) 0.5 ML injection Inject into the muscle. 0.5 mL 0   meloxicam  (MOBIC ) 15 MG tablet TAKE 1 TABLET BY MOUTH EVERY DAY 30 tablet 0   Multiple Vitamin (MULTIVITAMIN PO) Take 1 tablet by mouth daily.     No current facility-administered medications on file prior to visit.    Past Surgical History:  Procedure Laterality Date   BREAST CYST ASPIRATION Right 2005    No Known  Allergies  BP 108/80   Ht 5' 6 (1.676 m)   Wt 162 lb (73.5 kg)   BMI 26.15 kg/m       No data to display              No data to display              Objective:  Physical Exam:  Gen: NAD, comfortable in exam room  On examination of patient's knee no signs of erythema, ecchymoses, or generalized edema.  Does show evidence of bilateral joint effusion with some edema both medial and lateral to patellar tendon.  Denies any significant tenderness to palpation over joint line or bony landmarks.  Patient has full active and passive range of motion. Strength 5/5 in all planes of motion.  Patient neurovascularly intact distally.  Negative anterior/posterior drawer test.  Negative Lachman's.  Negative varus valgus stress testing.  Negative McMurray's test.    Limited ultrasound evaluation of the left knee shows swelling of suprapatellar pouch and joint space evaluation shows joint effusion extending outwards from intra-articular joint space to superficial subdermal space.  Exam consistent with normal physiologic joint effusion likely secondary to mild/moderate osteoarthritis.   Assessment & Plan:   #Bilateral knee effusions #Primarily asymptomatic bilateral mild/moderate knee osteoarthritis -Patient reassured after describing physiology that is currently causing anterior knee swelling.  Also showed patient fluid ultrasound and how it is coming from the intra-articular joint space. -Given the fact that the swelling is asymptomatic and she is not currently dealing with any  significant knee pain from bilateral knee osteoarthritis elected to take laissez-faire approach at this time -Recommended continuing low impact aerobic exercise as well as we discussed resistant training to help offload pressure from knees -No medication required at this time -Patient will follow-up she experiences any new or worsening of symptoms. -- Patient understands and agrees to treatment plan.  No further  questions or concerns at this time.

## 2023-11-23 ENCOUNTER — Encounter: Payer: Self-pay | Admitting: Podiatry

## 2023-11-23 ENCOUNTER — Ambulatory Visit: Admitting: Podiatry

## 2023-11-23 ENCOUNTER — Ambulatory Visit

## 2023-11-23 DIAGNOSIS — M2042 Other hammer toe(s) (acquired), left foot: Secondary | ICD-10-CM

## 2023-11-23 DIAGNOSIS — M205X2 Other deformities of toe(s) (acquired), left foot: Secondary | ICD-10-CM

## 2023-11-23 DIAGNOSIS — M205X1 Other deformities of toe(s) (acquired), right foot: Secondary | ICD-10-CM | POA: Diagnosis not present

## 2023-11-23 NOTE — Progress Notes (Signed)
  Subjective:  Patient ID: Jasmine Chambers, female    DOB: Nov 21, 1950,   MRN: [id number]  Chief Complaint  Patient presents with   Foot Pain    My big toe on my left foot has been bothering me.  Can the pain travel up my leg?    73 y.o. female presents for concern of pain in her left great toe relates she has been dealing with this for years but recently started getting pain in her hip and DOWN her leg and is wondering if this was related.  She had orthotics back in 2017 she relates and these have been helpful.  But wondering if they are no longer doing the job.  Denies any significant pain in the toe. Denies any other pedal complaints. Denies n/v/f/c.   No past medical history on file.  Objective:  Physical Exam: Vascular: DP/PT pulses 2/4 bilateral. CFT <3 seconds. Normal hair growth on digits. No edema.  Skin. No lacerations or abrasions bilateral feet.  Musculoskeletal: MMT 5/5 bilateral lower extremities in DF, PF, Inversion and Eversion. Deceased ROM in DF of ankle joint.  Left hallux with limited range of motion of the first MPJ and tender to palpation of the dorsal first MPJ as well as with end range of motion. Neurological: Sensation intact to light touch.   Assessment:   1. Hallux limitus, left      Plan:  Patient was evaluated and treated and all questions answered. -Xrays reviewed no acute fractures or dislocations noted.  Narrowing of joint space of the first MPJ on the right.  Osteophytes noted with dorsal flag sign noted as well.  -Discussed hallux limitus and  treatement options; conservative and  Surgical management; risks, benefits, alternatives discussed. All patient's questions answered. -Rx orthotics discussed.  Will get fitted. - Anti-inflammatories as needed. -Recommend continue with good supportive shoes and inserts. Discussed stiff soled shoes and use of carbon fiber foot plate.   -Patient to return to office as needed or sooner if condition  worsens.   Asberry Failing, DPM

## 2023-11-30 ENCOUNTER — Other Ambulatory Visit (HOSPITAL_COMMUNITY): Payer: Self-pay | Admitting: Family Medicine

## 2023-11-30 DIAGNOSIS — E785 Hyperlipidemia, unspecified: Secondary | ICD-10-CM

## 2023-12-20 ENCOUNTER — Other Ambulatory Visit (HOSPITAL_BASED_OUTPATIENT_CLINIC_OR_DEPARTMENT_OTHER)

## 2023-12-21 ENCOUNTER — Telehealth: Payer: Self-pay

## 2023-12-21 NOTE — Telephone Encounter (Signed)
 Patient's orthotics are here. No charges have been placed as of now. Charges will need to be placed.

## 2023-12-22 ENCOUNTER — Other Ambulatory Visit (HOSPITAL_BASED_OUTPATIENT_CLINIC_OR_DEPARTMENT_OTHER)

## 2023-12-22 DIAGNOSIS — E785 Hyperlipidemia, unspecified: Secondary | ICD-10-CM | POA: Insufficient documentation

## 2023-12-26 ENCOUNTER — Other Ambulatory Visit: Payer: Self-pay | Admitting: Family Medicine

## 2023-12-26 DIAGNOSIS — Z1231 Encounter for screening mammogram for malignant neoplasm of breast: Secondary | ICD-10-CM

## 2023-12-27 ENCOUNTER — Ambulatory Visit
Admission: RE | Admit: 2023-12-27 | Discharge: 2023-12-27 | Disposition: A | Discharge status: Home / Self Care | Source: Ambulatory Visit | Attending: Family Medicine | Admitting: Family Medicine

## 2023-12-27 DIAGNOSIS — Z1231 Encounter for screening mammogram for malignant neoplasm of breast: Secondary | ICD-10-CM

## 2024-01-02 ENCOUNTER — Other Ambulatory Visit (HOSPITAL_BASED_OUTPATIENT_CLINIC_OR_DEPARTMENT_OTHER): Payer: Self-pay

## 2024-01-02 MED ORDER — FLUZONE HIGH-DOSE 0.5 ML IM SUSY
0.5000 mL | PREFILLED_SYRINGE | Freq: Once | INTRAMUSCULAR | 0 refills | Status: AC
  Filled 2024-01-02: qty 0.5, 1d supply, fill #0

## 2024-01-02 MED ORDER — COMIRNATY 30 MCG/0.3ML IM SUSY
0.3000 mL | PREFILLED_SYRINGE | Freq: Once | INTRAMUSCULAR | 0 refills | Status: AC
  Filled 2024-01-02: qty 0.3, 1d supply, fill #0

## 2024-01-03 NOTE — Telephone Encounter (Signed)
 I left the patient a message asking her to call and schedule an appointment to pick up orthotics.  Charges were entered for the orthotics.

## 2024-01-09 ENCOUNTER — Ambulatory Visit

## 2024-01-09 NOTE — Progress Notes (Unsigned)
 Pt. Not happy with Custom orthotics. They are not made like previous orthotics . She wants to wait for Lolita so they are made properly. Did not pick up orthotics. 01/09/24.[email or web address]

## 2024-01-13 ENCOUNTER — Telehealth: Payer: Self-pay

## 2024-01-13 NOTE — Telephone Encounter (Signed)
 LVM asking pt to call office to schedule appt for adjustment that needs to be made to orthotics.

## 2024-01-19 ENCOUNTER — Ambulatory Visit

## 2024-01-19 DIAGNOSIS — M205X2 Other deformities of toe(s) (acquired), left foot: Secondary | ICD-10-CM

## 2024-01-19 NOTE — Progress Notes (Signed)
 Patient presents for orthotic scan. She states the ones we ordered for her are not right and she would like a pair similar to her old ones.    She was scanned today.  New orthotic ordered with same accoms as old orthotics she has today.   First ray curtout on left with soft extension 2-5 (reverse mortons extension) Full length 11 W Neoprene topcover.   Right no accommodations.    intrisic rearfoot post.    First cut out of shell.  Soft extension 2-5 mets to toes. Left only  Orthotic ordered.  Will call when ready for pick up.

## 2024-02-07 ENCOUNTER — Encounter (HOSPITAL_BASED_OUTPATIENT_CLINIC_OR_DEPARTMENT_OTHER): Admitting: Radiology

## 2024-02-07 DIAGNOSIS — Z1231 Encounter for screening mammogram for malignant neoplasm of breast: Secondary | ICD-10-CM
